# Patient Record
Sex: Female | Born: 1995 | Race: Black or African American | Hispanic: No | Marital: Single | Smoking: Former smoker
Health system: Southern US, Community
[De-identification: ages and names within clinical notes are randomized; demographics above are authoritative.]

## PROBLEM LIST (undated history)

## (undated) DIAGNOSIS — Z5189 Encounter for other specified aftercare: Secondary | ICD-10-CM

## (undated) DIAGNOSIS — N390 Urinary tract infection, site not specified: Secondary | ICD-10-CM

## (undated) HISTORY — PX: APPENDECTOMY: SHX54

---

## 2016-04-21 ENCOUNTER — Encounter (HOSPITAL_COMMUNITY): Payer: Self-pay | Admitting: Emergency Medicine

## 2016-04-21 ENCOUNTER — Inpatient Hospital Stay (HOSPITAL_COMMUNITY)
Admission: EM | Admit: 2016-04-21 | Discharge: 2016-04-27 | DRG: 871 | Disposition: A | Discharge status: Home / Self Care | Payer: Medicaid Other | Attending: Internal Medicine | Admitting: Internal Medicine

## 2016-04-21 ENCOUNTER — Emergency Department (HOSPITAL_COMMUNITY): Payer: Medicaid Other

## 2016-04-21 DIAGNOSIS — R197 Diarrhea, unspecified: Secondary | ICD-10-CM | POA: Diagnosis present

## 2016-04-21 DIAGNOSIS — Z87891 Personal history of nicotine dependence: Secondary | ICD-10-CM | POA: Diagnosis not present

## 2016-04-21 DIAGNOSIS — N12 Tubulo-interstitial nephritis, not specified as acute or chronic: Secondary | ICD-10-CM | POA: Diagnosis present

## 2016-04-21 DIAGNOSIS — Z79899 Other long term (current) drug therapy: Secondary | ICD-10-CM

## 2016-04-21 DIAGNOSIS — D509 Iron deficiency anemia, unspecified: Secondary | ICD-10-CM | POA: Diagnosis present

## 2016-04-21 DIAGNOSIS — J189 Pneumonia, unspecified organism: Secondary | ICD-10-CM | POA: Diagnosis present

## 2016-04-21 DIAGNOSIS — R161 Splenomegaly, not elsewhere classified: Secondary | ICD-10-CM

## 2016-04-21 DIAGNOSIS — Y95 Nosocomial condition: Secondary | ICD-10-CM | POA: Diagnosis not present

## 2016-04-21 DIAGNOSIS — E876 Hypokalemia: Secondary | ICD-10-CM | POA: Diagnosis present

## 2016-04-21 DIAGNOSIS — A419 Sepsis, unspecified organism: Principal | ICD-10-CM | POA: Diagnosis present

## 2016-04-21 DIAGNOSIS — K8020 Calculus of gallbladder without cholecystitis without obstruction: Secondary | ICD-10-CM | POA: Diagnosis present

## 2016-04-21 DIAGNOSIS — N390 Urinary tract infection, site not specified: Secondary | ICD-10-CM | POA: Diagnosis present

## 2016-04-21 DIAGNOSIS — R0602 Shortness of breath: Secondary | ICD-10-CM

## 2016-04-21 DIAGNOSIS — E860 Dehydration: Secondary | ICD-10-CM

## 2016-04-21 HISTORY — DX: Urinary tract infection, site not specified: N39.0

## 2016-04-21 HISTORY — DX: Encounter for other specified aftercare: Z51.89

## 2016-04-21 LAB — COMPREHENSIVE METABOLIC PANEL
ALK PHOS: 56 U/L (ref 38–126)
ALT: 19 U/L (ref 14–54)
AST: 28 U/L (ref 15–41)
Albumin: 4.5 g/dL (ref 3.5–5.0)
Anion gap: 15 (ref 5–15)
BILIRUBIN TOTAL: 10.4 mg/dL — AB (ref 0.3–1.2)
BUN: 20 mg/dL (ref 6–20)
CO2: 21 mmol/L — ABNORMAL LOW (ref 22–32)
Calcium: 9.3 mg/dL (ref 8.9–10.3)
Chloride: 100 mmol/L — ABNORMAL LOW (ref 101–111)
Creatinine, Ser: 0.87 mg/dL (ref 0.44–1.00)
GFR calc Af Amer: >60 mL/min (ref >60)
Glucose, Bld: 122 mg/dL — ABNORMAL HIGH (ref 65–99)
POTASSIUM: 2.7 mmol/L — AB (ref 3.5–5.1)
Sodium: 136 mmol/L (ref 135–145)
TOTAL PROTEIN: 9.4 g/dL — AB (ref 6.5–8.1)

## 2016-04-21 LAB — CBC WITH DIFFERENTIAL/PLATELET

## 2016-04-21 LAB — URINALYSIS, ROUTINE W REFLEX MICROSCOPIC
GLUCOSE, UA: NEGATIVE mg/dL
Ketones, ur: 20 mg/dL — AB
NITRITE: NEGATIVE
PH: 5 (ref 5.0–8.0)
Protein, ur: 100 mg/dL — AB
Specific Gravity, Urine: 1.017 (ref 1.005–1.030)

## 2016-04-21 LAB — CBC
HCT: 20.5 % — ABNORMAL LOW (ref 36.0–46.0)
HEMATOCRIT: 31.4 % — AB (ref 36.0–46.0)
Hemoglobin: 11.2 g/dL — ABNORMAL LOW (ref 12.0–15.0)
Hemoglobin: 7.3 g/dL — ABNORMAL LOW (ref 12.0–15.0)
MCH: 29 pg (ref 26.0–34.0)
MCH: 29.1 pg (ref 26.0–34.0)
MCHC: 35.7 g/dL (ref 30.0–36.0)
MCHC: 35.6 g/dL (ref 30.0–36.0)
MCV: 81.3 fL (ref 78.0–100.0)
MCV: 81.7 fL (ref 78.0–100.0)
PLATELETS: 207 10*3/uL (ref 150–400)
PLATELETS: 170 10*3/uL (ref 150–400)
RBC: 3.86 MIL/uL — ABNORMAL LOW (ref 3.87–5.11)
RBC: 2.51 MIL/uL — AB (ref 3.87–5.11)
RDW: 19.1 % — AB (ref 11.5–15.5)
RDW: 19.5 % — ABNORMAL HIGH (ref 11.5–15.5)
WBC: 34.2 10*3/uL — AB (ref 4.0–10.5)
WBC: 24.8 10*3/uL — AB (ref 4.0–10.5)

## 2016-04-21 LAB — HEPATIC FUNCTION PANEL
ALBUMIN: 3.4 g/dL — AB (ref 3.5–5.0)
ALT: 14 U/L (ref 14–54)
AST: 21 U/L (ref 15–41)
Alkaline Phosphatase: 44 U/L (ref 38–126)
Bilirubin, Direct: 3.2 mg/dL — ABNORMAL HIGH (ref 0.1–0.5)
Indirect Bilirubin: 3.8 mg/dL — ABNORMAL HIGH (ref 0.3–0.9)
TOTAL PROTEIN: 7.3 g/dL (ref 6.5–8.1)
Total Bilirubin: 7 mg/dL — ABNORMAL HIGH (ref 0.3–1.2)

## 2016-04-21 LAB — LACTATE DEHYDROGENASE: LDH: 237 U/L — AB (ref 98–192)

## 2016-04-21 LAB — DIFFERENTIAL
Basophils Absolute: 0.1 10*3/uL (ref 0.0–0.1)
Basophils Relative: 0 %
EOS PCT: 0 %
Eosinophils Absolute: 0 10*3/uL (ref 0.0–0.7)
LYMPHS ABS: 1.3 10*3/uL (ref 0.7–4.0)
LYMPHS PCT: 4 %
Monocytes Absolute: 1.4 10*3/uL — ABNORMAL HIGH (ref 0.1–1.0)
Monocytes Relative: 4 %
NEUTROS ABS: 29.9 10*3/uL — AB (ref 1.7–7.7)
NEUTROS PCT: 92 %

## 2016-04-21 LAB — APTT: aPTT: 39 seconds — ABNORMAL HIGH (ref 24–36)

## 2016-04-21 LAB — DIRECT ANTIGLOBULIN TEST (NOT AT ARMC)
DAT, IgG: NEGATIVE
DAT, complement: NEGATIVE

## 2016-04-21 LAB — RAPID URINE DRUG SCREEN, HOSP PERFORMED
Amphetamines: NOT DETECTED
BARBITURATES: NOT DETECTED
BENZODIAZEPINES: NOT DETECTED
Cocaine: NOT DETECTED
Opiates: NOT DETECTED
Tetrahydrocannabinol: POSITIVE — AB

## 2016-04-21 LAB — INFLUENZA PANEL BY PCR (TYPE A & B)
INFLAPCR: NEGATIVE
INFLBPCR: NEGATIVE

## 2016-04-21 LAB — LACTIC ACID, PLASMA
Lactic Acid, Venous: 1.1 mmol/L (ref 0.5–1.9)
Lactic Acid, Venous: 2.3 mmol/L (ref 0.5–1.9)

## 2016-04-21 LAB — I-STAT BETA HCG BLOOD, ED (MC, WL, AP ONLY): I-stat hCG, quantitative: <5 m[IU]/mL (ref <5)

## 2016-04-21 LAB — I-STAT CG4 LACTIC ACID, ED
Lactic Acid, Venous: 2.38 mmol/L (ref 0.5–1.9)
Lactic Acid, Venous: 1.07 mmol/L (ref 0.5–1.9)

## 2016-04-21 LAB — PROTIME-INR
INR: 1.53
PROTHROMBIN TIME: 18.5 s — AB (ref 11.4–15.2)

## 2016-04-21 LAB — CREATININE, SERUM
CREATININE: 0.76 mg/dL (ref 0.44–1.00)
GFR calc Af Amer: >60 mL/min (ref >60)
GFR calc non Af Amer: >60 mL/min (ref >60)

## 2016-04-21 LAB — LIPASE, BLOOD: Lipase: 19 U/L (ref 11–51)

## 2016-04-21 LAB — MRSA PCR SCREENING
MRSA by PCR: NEGATIVE
MRSA by PCR: NEGATIVE

## 2016-04-21 LAB — MAGNESIUM: MAGNESIUM: 1.6 mg/dL — AB (ref 1.7–2.4)

## 2016-04-21 LAB — PROCALCITONIN: Procalcitonin: 1.64 ng/mL

## 2016-04-21 MED ORDER — ONDANSETRON HCL 4 MG/2ML IJ SOLN
4.0000 mg | Freq: Once | INTRAMUSCULAR | Status: AC
  Administered 2016-04-21: 4 mg via INTRAVENOUS
  Filled 2016-04-21: qty 2

## 2016-04-21 MED ORDER — DEXTROSE 5 % IV SOLN
2.0000 g | Freq: Once | INTRAVENOUS | Status: DC
  Filled 2016-04-21: qty 2

## 2016-04-21 MED ORDER — SODIUM CHLORIDE 0.9 % IV BOLUS (SEPSIS): 1000.0000 mL | Freq: Once | INTRAVENOUS | Status: DC

## 2016-04-21 MED ORDER — ONDANSETRON HCL 4 MG/2ML IJ SOLN
4.0000 mg | Freq: Four times a day (QID) | INTRAMUSCULAR | Status: DC | PRN
  Administered 2016-04-21 – 2016-04-24 (×7): 4 mg via INTRAVENOUS
  Filled 2016-04-21 (×7): qty 2

## 2016-04-21 MED ORDER — SODIUM CHLORIDE 0.9 % IV BOLUS (SEPSIS)
1000.0000 mL | Freq: Once | INTRAVENOUS | Status: AC
  Administered 2016-04-21: 1000 mL via INTRAVENOUS

## 2016-04-21 MED ORDER — ENOXAPARIN SODIUM 40 MG/0.4ML ~~LOC~~ SOLN
40.0000 mg | SUBCUTANEOUS | Status: DC
  Filled 2016-04-21: qty 0.4

## 2016-04-21 MED ORDER — SODIUM CHLORIDE 0.9 % IV BOLUS (SEPSIS): 250.0000 mL | Freq: Once | INTRAVENOUS | Status: DC

## 2016-04-21 MED ORDER — POTASSIUM CHLORIDE 10 MEQ/100ML IV SOLN
10.0000 meq | INTRAVENOUS | Status: AC
  Administered 2016-04-21 (×2): 10 meq via INTRAVENOUS
  Filled 2016-04-21 (×2): qty 100

## 2016-04-21 MED ORDER — ACETAMINOPHEN 325 MG PO TABS
650.0000 mg | ORAL_TABLET | Freq: Four times a day (QID) | ORAL | Status: DC | PRN
  Administered 2016-04-21 – 2016-04-26 (×4): 650 mg via ORAL
  Filled 2016-04-21 (×4): qty 2

## 2016-04-21 MED ORDER — DEXTROSE 5 % IV SOLN
1.0000 g | Freq: Once | INTRAVENOUS | Status: AC
  Administered 2016-04-21: 1 g via INTRAVENOUS
  Filled 2016-04-21: qty 10

## 2016-04-21 MED ORDER — POTASSIUM CHLORIDE 10 MEQ/100ML IV SOLN: 10.0000 meq | INTRAVENOUS | Status: DC

## 2016-04-21 MED ORDER — ACETAMINOPHEN 650 MG RE SUPP
650.0000 mg | Freq: Once | RECTAL | Status: AC
  Administered 2016-04-21: 650 mg via RECTAL
  Filled 2016-04-21: qty 1

## 2016-04-21 MED ORDER — KETOROLAC TROMETHAMINE 30 MG/ML IJ SOLN
30.0000 mg | Freq: Once | INTRAMUSCULAR | Status: AC
  Administered 2016-04-21: 30 mg via INTRAVENOUS
  Filled 2016-04-21: qty 1

## 2016-04-21 MED ORDER — ACETAMINOPHEN 650 MG RE SUPP
650.0000 mg | Freq: Four times a day (QID) | RECTAL | Status: DC | PRN
  Administered 2016-04-21: 650 mg via RECTAL
  Filled 2016-04-21: qty 1

## 2016-04-21 MED ORDER — DEXTROSE 5 % IV SOLN: 1.0000 g | INTRAVENOUS | Status: DC

## 2016-04-21 MED ORDER — SODIUM CHLORIDE 0.9 % IV SOLN
30.0000 meq | Freq: Once | INTRAVENOUS | Status: AC
  Filled 2016-04-21: qty 15

## 2016-04-21 MED ORDER — SODIUM CHLORIDE 0.9 % IV SOLN
30.0000 meq | Freq: Once | INTRAVENOUS | Status: AC
  Administered 2016-04-21: 30 meq via INTRAVENOUS
  Filled 2016-04-21: qty 15

## 2016-04-21 MED ORDER — ACETAMINOPHEN 500 MG PO TABS
1000.0000 mg | ORAL_TABLET | Freq: Once | ORAL | Status: AC
  Administered 2016-04-21: 1000 mg via ORAL
  Filled 2016-04-21: qty 2

## 2016-04-21 MED ORDER — IOPAMIDOL (ISOVUE-300) INJECTION 61%
100.0000 mL | Freq: Once | INTRAVENOUS | Status: AC | PRN
  Administered 2016-04-21: 100 mL via INTRAVENOUS

## 2016-04-21 MED ORDER — POTASSIUM CHLORIDE IN NACL 20-0.9 MEQ/L-% IV SOLN
INTRAVENOUS | Status: DC
  Administered 2016-04-21 – 2016-04-23 (×3): via INTRAVENOUS
  Filled 2016-04-21 (×4): qty 1000

## 2016-04-21 MED ORDER — ONDANSETRON HCL 4 MG PO TABS: 4.0000 mg | ORAL_TABLET | Freq: Four times a day (QID) | ORAL | Status: DC | PRN

## 2016-04-21 MED ORDER — SODIUM CHLORIDE 0.9 % IV BOLUS (SEPSIS)
2000.0000 mL | Freq: Once | INTRAVENOUS | Status: AC
  Administered 2016-04-21: 2000 mL via INTRAVENOUS

## 2016-04-21 NOTE — H&P (Signed)
History and Physical    Sara Greekmaree Longfield ZOX:[id number] DOB: 07-17-1995 DOA: 04/21/2016  PCP: No PCP Per Patient  Patient coming from: home  Chief Complaint: vomiting  HPI: Sara Roth is a 20 y.o. female with no significant past medical history presents to ED with a 2 day history of vomiting. She reports that over the past few days she's experienced diffuse myalgias, persistent vomiting, unable to keep anything down, no diarrhea, right upper quadrant abdominal pain. She reports that her emesis is orange and occasionally green in color. She is unsure whether she's had any fever, but has had chills. She's felt increasingly weak. She reports being told that she has a urinary tract infection a few weeks ago and was prescribed a course of antibiotics. She ended up filling this prescription the day before yesterday due to financial issues. She denies any sick contacts. [facility] [employer] since she is an employee there. Denies any sore throat, cough, shortness of breath, URI type symptoms.  ED Course: She was evaluated in the emergency room where she was noted to be febrile with a temperature 103 Fahrenheit. She was tachycardic with a heart rate in the 120s to 160 range. Lab work reveals WBC count 34.2. Hemoglobin 11.2 and platelets of 207. Lactic acid elevated at 2.3. She is hypokalemic with a potassium of 2.7. Bilirubin is elevated at 10.4. Lipase is normal. Transaminases are normal. Urinalysis indicates infection. Influenza panel was negative. Pregnancy test is negative. CT scan of the abdomen and pelvis confirms left-sided pyelonephritis. There is no reported biliary ductal dilatation. Patient has been referred for admission.  Review of Systems: As per HPI otherwise 10 point review of systems negative.    Past Medical History:  Diagnosis Date  . Blood transfusion without reported diagnosis   . UTI (urinary tract infection)     Past Surgical History:  Procedure Laterality Date  .  APPENDECTOMY       reports that she has quit smoking. She has never used smokeless tobacco. She reports that she uses drugs, including Marijuana. She reports that she does not drink alcohol.  No Known Allergies  Family history: Family history reviewed and not pertinent  Prior to Admission medications   Medication Sig Start Date End Date Taking? Authorizing Provider  metroNIDAZOLE (FLAGYL) 500 MG tablet Take 500 mg by mouth 2 (two) times daily.   Yes Historical Provider, MD    Physical Exam: Vitals:   04/21/16 1430 04/21/16 1530 04/21/16 1600 04/21/16 1602  BP: 114/64 116/57 (!) 115/54   Pulse: (!) 129 (!) 121 (!) 127   Resp: (!) 30 (!) 35 (!) 38   Temp:    99.7 F (37.6 C)  TempSrc:    Oral  SpO2: 100% 98% 97%   Weight:      Height:          Constitutional: NAD, calm, comfortable Vitals:   04/21/16 1430 04/21/16 1530 04/21/16 1600 04/21/16 1602  BP: 114/64 116/57 (!) 115/54   Pulse: (!) 129 (!) 121 (!) 127   Resp: (!) 30 (!) 35 (!) 38   Temp:    99.7 F (37.6 C)  TempSrc:    Oral  SpO2: 100% 98% 97%   Weight:      Height:       Eyes: PERRL, lids and conjunctivae normal ENMT: Mucous membranes are moist. Posterior pharynx clear of any exudate or lesions.Normal dentition.  Neck: normal, supple, no masses, no thyromegaly Respiratory: clear to auscultation bilaterally, no wheezing,  no crackles. Normal respiratory effort. No accessory muscle use.  Cardiovascular: tachycardic, no murmurs / rubs / gallops. No extremity edema. 2+ pedal pulses. No carotid bruits.  Abdomen: tender in RUQ and epigastrium, no masses palpated. Bowel sounds positive.  Musculoskeletal: no clubbing / cyanosis. No joint deformity upper and lower extremities. Good ROM, no contractures. Normal muscle tone.  Skin: no rashes, lesions, ulcers. No induration Neurologic: CN 2-12 grossly intact. Sensation intact, DTR normal. Strength 5/5 in all 4.  Psychiatric: Normal judgment and insight. Alert and  oriented x 3. Normal mood.     Labs on Admission: I have personally reviewed following labs and imaging studies  CBC:  Recent Labs Lab 04/21/16 1147 04/21/16 1216  WBC 34.2* DUPLICATE  NEUTROABS 29.9* PENDING  HGB 11.2* DUPLICATE  HCT 31.4* DUPLICATE  MCV 81.3 DUPLICATE  PLT 207 DUPLICATE   Basic Metabolic Panel:  Recent Labs Lab 04/21/16 1147  NA 136  K 2.7*  CL 100*  CO2 21*  GLUCOSE 122*  BUN 20  CREATININE 0.87  CALCIUM 9.3   GFR: Estimated Creatinine Clearance: 92.8 mL/min (by C-G formula based on SCr of 0.87 mg/dL). Liver Function Tests:  Recent Labs Lab 04/21/16 1147  AST 28  ALT 19  ALKPHOS 56  BILITOT 10.4*  PROT 9.4*  ALBUMIN 4.5    Recent Labs Lab 04/21/16 1147  LIPASE 19   No results for input(s): AMMONIA in the last 168 hours. Coagulation Profile: No results for input(s): INR, PROTIME in the last 168 hours. Cardiac Enzymes: No results for input(s): CKTOTAL, CKMB, CKMBINDEX, TROPONINI in the last 168 hours. BNP (last 3 results) No results for input(s): PROBNP in the last 8760 hours. HbA1C: No results for input(s): HGBA1C in the last 72 hours. CBG: No results for input(s): GLUCAP in the last 168 hours. Lipid Profile: No results for input(s): CHOL, HDL, LDLCALC, TRIG, CHOLHDL, LDLDIRECT in the last 72 hours. Thyroid Function Tests: No results for input(s): TSH, T4TOTAL, FREET4, T3FREE, THYROIDAB in the last 72 hours. Anemia Panel: No results for input(s): VITAMINB12, FOLATE, FERRITIN, TIBC, IRON, RETICCTPCT in the last 72 hours. Urine analysis:    Component Value Date/Time   COLORURINE AMBER (A) 04/21/2016 1147   APPEARANCEUR HAZY (A) 04/21/2016 1147   LABSPEC 1.017 04/21/2016 1147   PHURINE 5.0 04/21/2016 1147   GLUCOSEU NEGATIVE 04/21/2016 1147   HGBUR MODERATE (A) 04/21/2016 1147   BILIRUBINUR SMALL (A) 04/21/2016 1147   KETONESUR 20 (A) 04/21/2016 1147   PROTEINUR 100 (A) 04/21/2016 1147   NITRITE NEGATIVE 04/21/2016 1147    LEUKOCYTESUR SMALL (A) 04/21/2016 1147   Sepsis Labs: !!!!!!!!!!!!!!!!!!!!!!!!!!!!!!!!!!!!!!!!!!!! @LABRCNTIP (procalcitonin:4,lacticidven:4) )No results found for this or any previous visit (from the past 240 hour(s)).   Radiological Exams on Admission: Ct Abdomen Pelvis W Contrast  Result Date: 04/21/2016 CLINICAL DATA:  Vomiting for 2 days, body aches, fever, headache, former smoker EXAM: CT ABDOMEN AND PELVIS WITH CONTRAST TECHNIQUE: Multidetector CT imaging of the abdomen and pelvis was performed using the standard protocol following bolus administration of intravenous contrast. Sagittal and coronal MPR images reconstructed from axial data set. CONTRAST:  ISOVUE-300 IOPAMIDOL (ISOVUE-300) INJECTION 61% IV. Oral contrast was not administered. COMPARISON:  None FINDINGS: Lower chest: Lung bases clear Hepatobiliary: Calcified gallstones in gallbladder largest 1.5 cm diameter. Liver unremarkable. Pancreas: Normal appearance Spleen: Prominent spleen, 14.8 x 5.4 x 14.3 cm (volume = 600 cm^3) without focal lesion Adrenals/Urinary Tract: Patchy LEFT nephrogram most consistent with pyelonephritis. No urinary tract calcification or dilatation. No  renal mass lesions. Ureters and bladder normal appearance. Stomach/Bowel: Appendix not definitely visualized. Suboptimal assessment of stomach and bowel loops due to lack of GI contrast and adequate distention, no gross abnormality seen Vascular/Lymphatic: Vascular structures unremarkable. Few normal sized LEFT para-aortic lymph nodes. Reproductive: Unremarkable Other: No free air free fluid.  No hernia. Musculoskeletal: Bones unremarkable. IMPRESSION: Patchy LEFT nephrogram consistent with pyelonephritis, recommend correlation with urinalysis. Cholelithiasis. Electronically Signed   By: Ulyses SouthwardMark  Boles M.D.   On: 04/21/2016 15:13   Dg Chest Portable 1 View  Result Date: 04/21/2016 CLINICAL DATA:  Code sepsis EXAM: PORTABLE CHEST 1 VIEW COMPARISON:  None.  FINDINGS: Normal heart size. Lungs clear. No pneumothorax. No pleural effusion. IMPRESSION: No active disease. Electronically Signed   By: Jolaine ClickArthur  Hoss M.D.   On: 04/21/2016 13:07    EKG: Independently reviewed. Sinus tachycardia  Assessment/Plan Active Problems:   UTI (urinary tract infection)   Pyelonephritis   Sepsis (HCC)   Hypokalemia   Hyperbilirubinemia   Splenomegaly   1. Sepsis. Likely related to pyelonephritis. She's been started on intravenous Rocephin. Blood cultures have been sent. She is aggressively hydrated with IV fluids per sepsis protocol. Follow blood cultures. Continue to trend lactic acid.  2. Pyelonephritis. Continue on Rocephin. Follow urine culture.  3. Hyperbilirubinemia. Etiology is unclear. She does not have any clear ductal dilatation, but with leukocytosis and fever, concern for underlying infection. I discussed the case with Dr. Elnoria HowardHung on-call for GI who [facility]. Other etiologies could include possible hemolysis. Will check LDH, haptoglobin, Coombs test and fractionated bilirubin. Case was also discussed with Dr. Clelia CroftShadad on-call for hematology who [facility].  4. Splenomegaly. Unclear significance. Await further hematology workup.  5. Hypokalemia. Likely related to persistent vomiting. Will replace and check magnesium.   DVT prophylaxis: lovenox Code Status: full Family Communication: no family present Disposition Plan: [facility] Consults called: GI Elnoria Howard(Hung), Hematology [facility](Shadad) Admission status: inpatient, stepdown   [facility]ON,JEHANZEB MD [facility] Pager [phone]  If 7PM-7AM, please contact night-coverage [email or web address] Password [email or web address]  04/21/2016, 4:35 PM

## 2016-04-21 NOTE — ED Notes (Signed)
CRITICAL VALUE ALERT  Critical value received:  I-stat Lactic 2.38                                       Potassium 2.7   Date of notification:  04/21/2016  Time of notification:  1234  Critical value read back:yes  Nurse who received alert:  J. Swanson/S. Gwen PoundsLashley  MD notified (1st page):  Zammit  Time of first page:  1234  MD notified (2nd page):  Time of second page:  Responding MD:  Estell HarpinZammit  Time MD responded:  1234

## 2016-04-21 NOTE — ED Triage Notes (Addendum)
Patient c/o nausea, vomiting, diarrhea, headache, body aches and fever that started 2 days ago and is progressively getting worse. Per patient being treated now for UTI and is currently taking antibiotics but is unaware of the name.

## 2016-04-21 NOTE — Progress Notes (Signed)
CRITICAL VALUE ALERT  Critical value received: Lactic Acid 2.3  Date of notification: 04/21/2016  Time of notification: 2345  Critical value read back:Yes.    Nurse who received alert:  A. Debborah Alonge  MD notified (1st page): Hamad,MD  Time of first page: 2350  MD notified (2nd page):  Time of second page:  Responding MD: Hamad,MD  Time MD responded: 2355

## 2016-04-21 NOTE — ED Notes (Signed)
Attempted to call report

## 2016-04-21 NOTE — ED Provider Notes (Signed)
[facility]-EMERGENCY DEPT Provider Note   CSN: [id number] Arrival date & time: 04/21/16  1121  By signing my name below, I, Soijett Blue, attest that this documentation has been prepared under the direction and in the presence of Bethann BerkshireJoseph Maurica Omura, MD. Electronically Signed: Soijett Blue, ED Scribe. 04/21/16. 12:17 PM.  History   Chief Complaint Chief Complaint  Patient presents with  . Emesis    HPI Sara Roth is a 20 y.o. female who presents to the Emergency Department complaining of progressively worsening emesis onset 2 days ago. She states that she is having associated symptoms of appetite change, generalized body aches, fever, and HA. She has not tried any medications for the relief of her symptoms. She denies sore throat, cough, and any other symptoms. Denies medical issues at this time.   The history is provided by the patient. No language interpreter was used.  Emesis   This is a new problem. The current episode started 2 days ago. The problem has not changed since onset.The emesis has an appearance of stomach contents. Associated symptoms include a fever, headaches and myalgias. Pertinent negatives include no abdominal pain, no chills, no cough and no diarrhea.    Past Medical History:  Diagnosis Date  . Blood transfusion without reported diagnosis   . UTI (urinary tract infection)     There are no active problems to display for this patient.   Past Surgical History:  Procedure Laterality Date  . APPENDECTOMY      OB History    Gravida Para Term Preterm AB Living   0 0 0 0 0 0   SAB TAB Ectopic Multiple Live Births   0 0 0 0 0       Home Medications    Prior to Admission medications   Not on File    Family History History reviewed. No pertinent family history.  Social History Social History  Substance Use Topics  . Smoking status: Former Games developermoker  . Smokeless tobacco: Never Used  . Alcohol use No     Allergies   Patient has no known  allergies.   Review of Systems Review of Systems  Constitutional: Positive for appetite change and fever. Negative for chills and fatigue.  HENT: Negative for congestion, ear discharge, sinus pressure and sore throat.   Eyes: Negative for discharge.  Respiratory: Negative for cough.   Cardiovascular: Negative for chest pain.  Gastrointestinal: Positive for vomiting. Negative for abdominal pain and diarrhea.  Genitourinary: Negative for frequency and hematuria.  Musculoskeletal: Positive for myalgias. Negative for back pain.  Skin: Negative for rash.  Neurological: Positive for headaches. Negative for seizures.  Psychiatric/Behavioral: Negative for hallucinations.     Physical Exam Updated Vital Signs BP 125/81   Pulse (!) 138   Temp 102.9 F (39.4 C) (Oral)   Resp 20   Ht 5\' 5"  (1.651 m)   Wt 148 lb (67.1 kg)   LMP 04/21/2016   SpO2 100%   BMI 24.63 kg/m   Physical Exam  Constitutional: She is oriented to person, place, and time. She appears well-developed.  HENT:  Head: Normocephalic.  Mouth/Throat: Mucous membranes are dry.  Eyes: Conjunctivae and EOM are normal. Scleral icterus is present.  Neck: Neck supple. No thyromegaly present.  Cardiovascular: Regular rhythm.  Tachycardia present.  Exam reveals no gallop and no friction rub.   No murmur heard. Pulmonary/Chest: No stridor. She has no wheezes. She has no rales. She exhibits no tenderness.  Abdominal: She exhibits no distension. There is  no tenderness. There is no rebound.  Musculoskeletal: Normal range of motion. She exhibits no edema.  Lymphadenopathy:    She has no cervical adenopathy.  Neurological: She is oriented to person, place, and time. She exhibits normal muscle tone. Coordination normal.  Skin: No rash noted. No erythema.  Psychiatric: She has a normal mood and affect. Her behavior is normal.  Nursing note and vitals reviewed.    ED Treatments / Results  DIAGNOSTIC STUDIES: Oxygen Saturation  is 100% on RA, nl by my interpretation.    COORDINATION OF CARE: 12:14 PM Discussed treatment plan with pt at bedside which includes labs, UA, tylenol, and pt agreed to plan.   Labs (all labs ordered are listed, but only abnormal results are displayed) Labs Reviewed  COMPREHENSIVE METABOLIC PANEL - Abnormal; Notable for the following:       Result Value   Potassium 2.7 (*)    Chloride 100 (*)    CO2 21 (*)    Glucose, Bld 122 (*)    Total Protein 9.4 (*)    Total Bilirubin 10.4 (*)    All other components within normal limits  CBC - Abnormal; Notable for the following:    WBC 34.2 (*)    RBC 3.86 (*)    Hemoglobin 11.2 (*)    HCT 31.4 (*)    RDW 19.1 (*)    All other components within normal limits  I-STAT CG4 LACTIC ACID, ED - Abnormal; Notable for the following:    Lactic Acid, Venous 2.38 (*)    All other components within normal limits  CULTURE, BLOOD (ROUTINE X 2)  CULTURE, BLOOD (ROUTINE X 2)  URINE CULTURE  LIPASE, BLOOD  CBC WITH DIFFERENTIAL/PLATELET  URINALYSIS, ROUTINE W REFLEX MICROSCOPIC  INFLUENZA PANEL BY PCR (TYPE A & B, H1N1)  RAPID URINE DRUG SCREEN, HOSP PERFORMED  I-STAT BETA HCG BLOOD, ED ([facility], [facility], [facility] ONLY)    EKG  EKG Interpretation None       Radiology No results found.  Procedures Procedures (including critical care time)  Medications Ordered in ED Medications  sodium chloride 0.9 % bolus 2,000 mL (not administered)  potassium chloride 10 mEq in 100 mL IVPB (not administered)  sodium chloride 0.9 % bolus 1,000 mL (not administered)  sodium chloride 0.9 % bolus 1,000 mL (1,000 mLs Intravenous New Bag/Given 04/21/16 1152)  sodium chloride 0.9 % bolus 1,000 mL (1,000 mLs Intravenous New Bag/Given 04/21/16 1152)  acetaminophen (TYLENOL) tablet 1,000 mg (1,000 mg Oral Given 04/21/16 1158)  ondansetron (ZOFRAN) injection 4 mg (4 mg Intravenous Given 04/21/16 1241)  ketorolac (TORADOL) 30 MG/ML injection 30 mg (30 mg Intravenous Given  04/21/16 1241)  cefTRIAXone (ROCEPHIN) 1 g in dextrose 5 % 50 mL IVPB (1 g Intravenous New Bag/Given 04/21/16 1241)     Initial Impression / Assessment and Plan / ED Course  I have reviewed the triage vital signs and the nursing notes.  Pertinent labs & imaging results that were available during my care of the patient were reviewed by me and considered in my medical decision making (see chart for details).  Clinical Course   CRITICAL CARE Performed by: Laquan Ludden L Total critical care time40 minutes Critical care time was exclusive of separately billable procedures and treating other patients. Critical care was necessary to treat or prevent imminent or life-threatening deterioration. Critical care was time spent personally by me on the following activities: development of treatment plan with patient and/or surrogate as well as nursing, discussions with consultants,  evaluation of patient's response to treatment, examination of patient, obtaining history from patient or surrogate, ordering and performing treatments and interventions, ordering and review of laboratory studies, ordering and review of radiographic studies, pulse oximetry and re-evaluation of patient's condition.   Patient will be admitted to stepdown. Diagnosis UTI elevated bilirubin.  The chart was scribed for me under my direct supervision.  I personally performed the history, physical, and medical decision making and all procedures in the evaluation of this patient..   Final Clinical Impressions(s) / ED Diagnoses   Final diagnoses:  None    New Prescriptions New Prescriptions   No medications on file   The chart was scribed for me under my direct supervision.  I personally performed the history, physical, and medical decision making and all procedures in the evaluation of this patient.Bethann Berkshire, MD 04/21/16 [phone]

## 2016-04-21 NOTE — ED Notes (Signed)
Pt ambulated to bathroom with no assist. 

## 2016-04-21 NOTE — ED Notes (Signed)
CRITICAL VALUE ALERT  Critical value received:  Potassium 2.7  Date of notification:  04/21/2016  Time of notification:  1233  Critical value read back:  Yes  Nurse who received alert:  Sophronia SimasJackie Keenon Leitzel  MD notified (1st page):  Dr. Estell HarpinZammit  Time of first page:  1234   Responding MD:  Dr. Estell HarpinZammit  Time MD responded:  [phone]

## 2016-04-21 NOTE — Consult Note (Signed)
Reason for Consult: Hyperbilirubinemia Referring Physician: [facility].  Orpah Greek HPI: This is a 20 year old female with no significant PMH admitted with complaints of vomiting, myalgias, and RUQ pain.  The symptoms started two days ago and it progressively worsened.  In the prior weeks she was told that she had a UTI and she was treated with antibiotics.  In the ER she was noted to have a TB at 10 and a WBC of 34,000.  A scan of the abdomen revealed a contracted gallbladder with a 1.5 cm calcified stone and no biliary ductal dilation.  Her liver enzymes were normal, but she complained about RUQ pain.  There was evidence of a left pyleonephritis.  Her serum lactate was >2.  Because of the high elevation in her TB a GI consultation was requested.  She was evaluated by surgery at [facility] and it was not felt that she had cholecystitis.  Repeat blood work here in [place] does reveal a trending down of her WBC to 24K and her TB dropped to 7.0  Fractionation was split evenly.  Past Medical History:  Diagnosis Date  . Blood transfusion without reported diagnosis   . UTI (urinary tract infection)     Past Surgical History:  Procedure Laterality Date  . APPENDECTOMY      History reviewed. No pertinent family history.  Social History:  reports that she has quit smoking. She has never used smokeless tobacco. She reports that she uses drugs, including Marijuana. She reports that she does not drink alcohol.  Allergies: No Known Allergies  Medications:  Scheduled: . acetaminophen  650 mg Rectal Once  . [START ON 04/22/2016] cefTRIAXone (ROCEPHIN)  IV  1 g Intravenous Q24H  . enoxaparin (LOVENOX) injection  40 mg Subcutaneous Q24H  . potassium chloride (KCL MULTIRUN) 30 mEq in 265 mL IVPB  30 mEq Intravenous Once  . potassium chloride (KCL MULTIRUN) 30 mEq in 265 mL IVPB  30 mEq Intravenous Once  . sodium chloride  1,000 mL Intravenous Once  . sodium chloride  1,000 mL Intravenous  Once   And  . sodium chloride  1,000 mL Intravenous Once   And  . sodium chloride  250 mL Intravenous Once  . sodium chloride  2,000 mL Intravenous Once   Continuous: . 0.9 % NaCl with KCl 20 mEq / L 100 mL/hr at 04/21/16 2038    Results for orders placed or performed during the hospital encounter of 04/21/16 (from the past 24 hour(s))  Lipase, blood     Status: None   Collection Time: 04/21/16 11:47 AM  Result Value Ref Range   Lipase 19 11 - 51 U/L  Comprehensive metabolic panel     Status: Abnormal   Collection Time: 04/21/16 11:47 AM  Result Value Ref Range   Sodium 136 135 - 145 mmol/L   Potassium 2.7 (LL) 3.5 - 5.1 mmol/L   Chloride 100 (L) 101 - 111 mmol/L   CO2 21 (L) 22 - 32 mmol/L   Glucose, Bld 122 (H) 65 - 99 mg/dL   BUN 20 6 - 20 mg/dL   Creatinine, Ser 1.61 0.44 - 1.00 mg/dL   Calcium 9.3 8.9 - 09.6 mg/dL   Total Protein 9.4 (H) 6.5 - 8.1 g/dL   Albumin 4.5 3.5 - 5.0 g/dL   AST 28 15 - 41 U/L   ALT 19 14 - 54 U/L   Alkaline Phosphatase 56 38 - 126 U/L   Total Bilirubin 10.4 (H)  0.3 - 1.2 mg/dL   GFR calc non Af Amer >60 >60 mL/min   GFR calc Af Amer >60 >60 mL/min   Anion gap 15 5 - 15  CBC     Status: Abnormal   Collection Time: 04/21/16 11:47 AM  Result Value Ref Range   WBC 34.2 (H) 4.0 - 10.5 K/uL   RBC 3.86 (L) 3.87 - 5.11 MIL/uL   Hemoglobin 11.2 (L) 12.0 - 15.0 g/dL   HCT 16.131.4 (L) 09.636.0 - 04.546.0 %   MCV 81.3 78.0 - 100.0 fL   MCH 29.0 26.0 - 34.0 pg   MCHC 35.7 30.0 - 36.0 g/dL   RDW 40.919.1 (H) 81.111.5 - 91.415.5 %   Platelets 207 150 - 400 K/uL  Urinalysis, Routine w reflex microscopic     Status: Abnormal   Collection Time: 04/21/16 11:47 AM  Result Value Ref Range   Color, Urine AMBER (A) YELLOW   APPearance HAZY (A) CLEAR   Specific Gravity, Urine 1.017 1.005 - 1.030   pH 5.0 5.0 - 8.0   Glucose, UA NEGATIVE NEGATIVE mg/dL   Hgb urine dipstick MODERATE (A) NEGATIVE   Bilirubin Urine SMALL (A) NEGATIVE   Ketones, ur 20 (A) NEGATIVE mg/dL    Protein, ur 782100 (A) NEGATIVE mg/dL   Nitrite NEGATIVE NEGATIVE   Leukocytes, UA SMALL (A) NEGATIVE   RBC / HPF 6-30 0 - 5 RBC/hpf   WBC, UA TOO NUMEROUS TO COUNT 0 - 5 WBC/hpf   Bacteria, UA RARE (A) NONE SEEN   Mucous PRESENT    Hyaline Casts, UA PRESENT    Non Squamous Epithelial 0-5 (A) NONE SEEN  Rapid urine drug screen (hospital performed)     Status: Abnormal   Collection Time: 04/21/16 11:47 AM  Result Value Ref Range   Opiates NONE DETECTED NONE DETECTED   Cocaine NONE DETECTED NONE DETECTED   Benzodiazepines NONE DETECTED NONE DETECTED   Amphetamines NONE DETECTED NONE DETECTED   Tetrahydrocannabinol POSITIVE (A) NONE DETECTED   Barbiturates NONE DETECTED NONE DETECTED  Differential     Status: Abnormal   Collection Time: 04/21/16 11:47 AM  Result Value Ref Range   Neutrophils Relative % 92 %   Neutro Abs 29.9 (H) 1.7 - 7.7 K/uL   Lymphocytes Relative 4 %   Lymphs Abs 1.3 0.7 - 4.0 K/uL   Monocytes Relative 4 %   Monocytes Absolute 1.4 (H) 0.1 - 1.0 K/uL   Eosinophils Relative 0 %   Eosinophils Absolute 0.0 0.0 - 0.7 K/uL   Basophils Relative 0 %   Basophils Absolute 0.1 0.0 - 0.1 K/uL  CBC WITH DIFFERENTIAL     Status: None (Preliminary result)   Collection Time: 04/21/16 12:16 PM  Result Value Ref Range   WBC DUPLICATE K/uL   RBC DUPLICATE MIL/uL   Hemoglobin DUPLICATE g/dL   HCT DUPLICATE %   MCV DUPLICATE fL   MCH DUPLICATE pg   MCHC DUPLICATE g/dL   RDW DUPLICATE %   Platelets DUPLICATE K/uL   Neutrophils Relative % PENDING %   Neutro Abs PENDING K/uL   Band Neutrophils PENDING %   Lymphocytes Relative PENDING %   Lymphs Abs PENDING K/uL   Monocytes Relative PENDING %   Monocytes Absolute PENDING K/uL   Eosinophils Relative PENDING %   Eosinophils Absolute PENDING K/uL   Basophils Relative PENDING %   Basophils Absolute PENDING K/uL   WBC Morphology PENDING    RBC Morphology PENDING    Smear Review  PENDING    Other PENDING %   nRBC PENDING /100  WBC   Metamyelocytes Relative PENDING %   Myelocytes PENDING %   Promyelocytes Absolute PENDING %   Blasts PENDING %  Influenza panel by PCR (type A & B, H1N1)     Status: None   Collection Time: 04/21/16 12:21 PM  Result Value Ref Range   Influenza A By PCR NEGATIVE NEGATIVE   Influenza B By PCR NEGATIVE NEGATIVE  I-Stat Beta hCG blood, ED ([facility], [facility], [facility] only)     Status: None   Collection Time: 04/21/16 12:24 PM  Result Value Ref Range   I-stat hCG, quantitative <5.0 <5 mIU/mL   Comment 3          I-Stat CG4 Lactic Acid, ED  (not at  [facility])     Status: Abnormal   Collection Time: 04/21/16 12:27 PM  Result Value Ref Range   Lactic Acid, Venous 2.38 (HH) 0.5 - 1.9 mmol/L   Comment NOTIFIED PHYSICIAN   Lactate dehydrogenase     Status: Abnormal   Collection Time: 04/21/16  4:44 PM  Result Value Ref Range   LDH 237 (H) 98 - 192 U/L  Hepatic function panel     Status: Abnormal   Collection Time: 04/21/16  4:44 PM  Result Value Ref Range   Total Protein 7.3 6.5 - 8.1 g/dL   Albumin 3.4 (L) 3.5 - 5.0 g/dL   AST 21 15 - 41 U/L   ALT 14 14 - 54 U/L   Alkaline Phosphatase 44 38 - 126 U/L   Total Bilirubin 7.0 (H) 0.3 - 1.2 mg/dL   Bilirubin, Direct 3.2 (H) 0.1 - 0.5 mg/dL   Indirect Bilirubin 3.8 (H) 0.3 - 0.9 mg/dL  MRSA PCR Screening     Status: None   Collection Time: 04/21/16  4:44 PM  Result Value Ref Range   MRSA by PCR NEGATIVE NEGATIVE  I-Stat CG4 Lactic Acid, ED  (not at  [facility])     Status: None   Collection Time: 04/21/16  5:07 PM  Result Value Ref Range   Lactic Acid, Venous 1.07 0.5 - 1.9 mmol/L  Lactic acid, plasma     Status: None   Collection Time: 04/21/16  7:07 PM  Result Value Ref Range   Lactic Acid, Venous 1.1 0.5 - 1.9 mmol/L  Procalcitonin     Status: None   Collection Time: 04/21/16  7:07 PM  Result Value Ref Range   Procalcitonin 1.64 ng/mL  Protime-INR     Status: Abnormal   Collection Time: 04/21/16  7:07 PM  Result Value Ref Range   Prothrombin Time  18.5 (H) 11.4 - 15.2 seconds   INR 1.53   APTT     Status: Abnormal   Collection Time: 04/21/16  7:07 PM  Result Value Ref Range   aPTT 39 (H) 24 - 36 seconds  CBC     Status: Abnormal   Collection Time: 04/21/16  7:07 PM  Result Value Ref Range   WBC 24.8 (H) 4.0 - 10.5 K/uL   RBC 2.51 (L) 3.87 - 5.11 MIL/uL   Hemoglobin 7.3 (L) 12.0 - 15.0 g/dL   HCT 16.1 (L) 09.6 - 04.5 %   MCV 81.7 78.0 - 100.0 fL   MCH 29.1 26.0 - 34.0 pg   MCHC 35.6 30.0 - 36.0 g/dL   RDW 40.9 (H) 81.1 - 91.4 %   Platelets 170 150 - 400 K/uL  Creatinine, serum  Status: None   Collection Time: 04/21/16  7:07 PM  Result Value Ref Range   Creatinine, Ser 0.76 0.44 - 1.00 mg/dL   GFR calc non Af Amer >60 >60 mL/min   GFR calc Af Amer >60 >60 mL/min  Magnesium     Status: Abnormal   Collection Time: 04/21/16  7:07 PM  Result Value Ref Range   Magnesium 1.6 (L) 1.7 - 2.4 mg/dL     Ct Abdomen Pelvis W Contrast  Result Date: 04/21/2016 CLINICAL DATA:  Vomiting for 2 days, body aches, fever, headache, former smoker EXAM: CT ABDOMEN AND PELVIS WITH CONTRAST TECHNIQUE: Multidetector CT imaging of the abdomen and pelvis was performed using the standard protocol following bolus administration of intravenous contrast. Sagittal and coronal MPR images reconstructed from axial data set. CONTRAST:  100mL ISOVUE-300 IOPAMIDOL (ISOVUE-300) INJECTION 61% IV. Oral contrast was not administered. COMPARISON:  None FINDINGS: Lower chest: Lung bases clear Hepatobiliary: Calcified gallstones in gallbladder largest 1.5 cm diameter. Liver unremarkable. Pancreas: Normal appearance Spleen: Prominent spleen, 14.8 x 5.4 x 14.3 cm (volume = 600 cm^3) without focal lesion Adrenals/Urinary Tract: Patchy LEFT nephrogram most consistent with pyelonephritis. No urinary tract calcification or dilatation. No renal mass lesions. Ureters and bladder normal appearance. Stomach/Bowel: Appendix not definitely visualized. Suboptimal assessment of stomach  and bowel loops due to lack of GI contrast and adequate distention, no gross abnormality seen Vascular/Lymphatic: Vascular structures unremarkable. Few normal sized LEFT para-aortic lymph nodes. Reproductive: Unremarkable Other: No free air free fluid.  No hernia. Musculoskeletal: Bones unremarkable. IMPRESSION: Patchy LEFT nephrogram consistent with pyelonephritis, recommend correlation with urinalysis. Cholelithiasis. Electronically Signed   By: Ulyses SouthwardMark  Boles M.D.   On: 04/21/2016 15:13   Dg Chest Portable 1 View  Result Date: 04/21/2016 CLINICAL DATA:  Code sepsis EXAM: PORTABLE CHEST 1 VIEW COMPARISON:  None. FINDINGS: Normal heart size. Lungs clear. No pneumothorax. No pleural effusion. IMPRESSION: No active disease. Electronically Signed   By: Jolaine ClickArthur  Hoss M.D.   On: 04/21/2016 13:07    ROS:  As stated above in the HPI otherwise negative.  Blood pressure 119/74, pulse (!) 129, temperature (!) 100.5 F (38.1 C), temperature source Oral, resp. rate (!) 27, height 5\' 5"  (1.651 m), weight 67.1 kg (148 lb), last menstrual period 04/21/2016, SpO2 100 %.    PE: Gen: NAD, Alert and Oriented HEENT:  Unable to evaluate in the dark. Neck: Supple, no LAD Lungs: CTA Bilaterally CV: RRR without M/G/R ABM: Soft, NTND, +BS Ext: No C/C/E  Assessment/Plan: 1) Sepsis. 2) Pyelonephritis. 3) Cholelithiasis.   I believe the patient's hyperbilirubinemia is secondary to sepsis as there are no other supporting facts for a biliary soruce, I.e., elevated liver enzymes in an obstructive pattern or dilated bile ducts.  I do not feel that her cholelithiasis plays a role in this situation, but only time will tell with the treatment of her RUQ pain.  I cannot reconcile her pain at this time in the RUQ, but she also has diffuse myalgias.  However,at this time she denies any further pain.  She did not provide any details for me during this interview as she was trying to sleep.  Nursing reported the same issue.  The  examination was negative for any pain.  Plan: 1) Treat her pyelonephritis. 2) Continue supportive care. 3) Follow labs.  Desirey Keahey D 04/21/2016, 8:47 PM

## 2016-04-22 DIAGNOSIS — D649 Anemia, unspecified: Secondary | ICD-10-CM

## 2016-04-22 DIAGNOSIS — N10 Acute tubulo-interstitial nephritis: Secondary | ICD-10-CM

## 2016-04-22 LAB — CBC
HCT: 20.4 % — ABNORMAL LOW (ref 36.0–46.0)
HEMATOCRIT: 19 % — AB (ref 36.0–46.0)
Hemoglobin: 7 g/dL — ABNORMAL LOW (ref 12.0–15.0)
Hemoglobin: 6.7 g/dL — CL (ref 12.0–15.0)
MCH: 28.2 pg (ref 26.0–34.0)
MCH: 28.9 pg (ref 26.0–34.0)
MCHC: 34.3 g/dL (ref 30.0–36.0)
MCHC: 35.3 g/dL (ref 30.0–36.0)
MCV: 82.3 fL (ref 78.0–100.0)
MCV: 81.9 fL (ref 78.0–100.0)
PLATELETS: 158 10*3/uL (ref 150–400)
PLATELETS: 173 10*3/uL (ref 150–400)
RBC: 2.48 MIL/uL — ABNORMAL LOW (ref 3.87–5.11)
RBC: 2.32 MIL/uL — AB (ref 3.87–5.11)
RDW: 19.5 % — AB (ref 11.5–15.5)
RDW: 19.2 % — AB (ref 11.5–15.5)
WBC: 18.4 10*3/uL — ABNORMAL HIGH (ref 4.0–10.5)
WBC: 15.2 10*3/uL — AB (ref 4.0–10.5)

## 2016-04-22 LAB — BASIC METABOLIC PANEL
Anion gap: 6 (ref 5–15)
BUN: 5 mg/dL — AB (ref 6–20)
CHLORIDE: 110 mmol/L (ref 101–111)
CO2: 18 mmol/L — ABNORMAL LOW (ref 22–32)
Calcium: 7.7 mg/dL — ABNORMAL LOW (ref 8.9–10.3)
Creatinine, Ser: 0.73 mg/dL (ref 0.44–1.00)
Glucose, Bld: 113 mg/dL — ABNORMAL HIGH (ref 65–99)
POTASSIUM: 3.1 mmol/L — AB (ref 3.5–5.1)
Sodium: 134 mmol/L — ABNORMAL LOW (ref 135–145)

## 2016-04-22 LAB — COMPREHENSIVE METABOLIC PANEL
ALBUMIN: 2.9 g/dL — AB (ref 3.5–5.0)
ALT: 14 U/L (ref 14–54)
ANION GAP: 8 (ref 5–15)
AST: 23 U/L (ref 15–41)
Alkaline Phosphatase: 41 U/L (ref 38–126)
BUN: 9 mg/dL (ref 6–20)
CHLORIDE: 112 mmol/L — AB (ref 101–111)
CO2: 18 mmol/L — AB (ref 22–32)
Calcium: 7.9 mg/dL — ABNORMAL LOW (ref 8.9–10.3)
Creatinine, Ser: 0.74 mg/dL (ref 0.44–1.00)
GFR calc Af Amer: >60 mL/min (ref >60)
GFR calc non Af Amer: >60 mL/min (ref >60)
GLUCOSE: 112 mg/dL — AB (ref 65–99)
POTASSIUM: 3.4 mmol/L — AB (ref 3.5–5.1)
SODIUM: 138 mmol/L (ref 135–145)
Total Bilirubin: 5.5 mg/dL — ABNORMAL HIGH (ref 0.3–1.2)
Total Protein: 6.3 g/dL — ABNORMAL LOW (ref 6.5–8.1)

## 2016-04-22 LAB — PREPARE RBC (CROSSMATCH)

## 2016-04-22 LAB — LACTIC ACID, PLASMA: LACTIC ACID, VENOUS: 1.4 mmol/L (ref 0.5–1.9)

## 2016-04-22 LAB — ABO/RH: ABO/RH(D): O POS

## 2016-04-22 LAB — HEMOGLOBIN AND HEMATOCRIT, BLOOD
HEMATOCRIT: 21.9 % — AB (ref 36.0–46.0)
HEMOGLOBIN: 7.8 g/dL — AB (ref 12.0–15.0)

## 2016-04-22 MED ORDER — PANTOPRAZOLE SODIUM 40 MG IV SOLR
40.0000 mg | Freq: Once | INTRAVENOUS | Status: AC
  Administered 2016-04-22: 40 mg via INTRAVENOUS
  Filled 2016-04-22: qty 40

## 2016-04-22 MED ORDER — IBUPROFEN 200 MG PO TABS
400.0000 mg | ORAL_TABLET | ORAL | Status: DC | PRN
  Administered 2016-04-22: 400 mg via ORAL
  Filled 2016-04-22: qty 2

## 2016-04-22 MED ORDER — VANCOMYCIN HCL IN DEXTROSE 750-5 MG/150ML-% IV SOLN
750.0000 mg | Freq: Three times a day (TID) | INTRAVENOUS | Status: DC
  Administered 2016-04-22 – 2016-04-23 (×4): 750 mg via INTRAVENOUS
  Filled 2016-04-22 (×6): qty 150

## 2016-04-22 MED ORDER — MAGNESIUM SULFATE 2 GM/50ML IV SOLN
2.0000 g | Freq: Once | INTRAVENOUS | Status: AC
  Administered 2016-04-22: 2 g via INTRAVENOUS
  Filled 2016-04-22: qty 50

## 2016-04-22 MED ORDER — PIPERACILLIN-TAZOBACTAM 3.375 G IVPB
3.3750 g | Freq: Three times a day (TID) | INTRAVENOUS | Status: DC
  Administered 2016-04-22 – 2016-04-23 (×4): 3.375 g via INTRAVENOUS
  Filled 2016-04-22 (×5): qty 50

## 2016-04-22 MED ORDER — MORPHINE SULFATE (PF) 2 MG/ML IV SOLN
INTRAVENOUS | Status: AC
  Administered 2016-04-22: 2 mg via INTRAVENOUS
  Filled 2016-04-22: qty 1

## 2016-04-22 MED ORDER — MORPHINE SULFATE (PF) 2 MG/ML IV SOLN
2.0000 mg | INTRAVENOUS | Status: DC | PRN
  Administered 2016-04-22 – 2016-04-24 (×4): 2 mg via INTRAVENOUS
  Filled 2016-04-22 (×4): qty 1

## 2016-04-22 MED ORDER — KETOROLAC TROMETHAMINE 15 MG/ML IJ SOLN
15.0000 mg | Freq: Three times a day (TID) | INTRAMUSCULAR | Status: AC | PRN
  Administered 2016-04-23: 15 mg via INTRAVENOUS
  Filled 2016-04-22 (×2): qty 1

## 2016-04-22 MED ORDER — SODIUM CHLORIDE 0.9 % IV BOLUS (SEPSIS)
1000.0000 mL | Freq: Once | INTRAVENOUS | Status: AC
  Administered 2016-04-21: 1000 mL via INTRAVENOUS

## 2016-04-22 MED ORDER — SODIUM CHLORIDE 0.9 % IV SOLN
Freq: Once | INTRAVENOUS | Status: AC
  Administered 2016-04-22: 15:00:00 via INTRAVENOUS

## 2016-04-22 MED ORDER — POTASSIUM CHLORIDE CRYS ER 20 MEQ PO TBCR
40.0000 meq | EXTENDED_RELEASE_TABLET | Freq: Once | ORAL | Status: DC
Start: 2016-04-22 — End: 2016-04-22

## 2016-04-22 NOTE — Progress Notes (Signed)
MD aware of Magnesium results of 1.6/ No new orders received at this time. Will continue to monitor closely.

## 2016-04-22 NOTE — Progress Notes (Signed)
Subjective: No complaints of abdominal pain.  Still with vomiting.  Objective: Vital signs in last 24 hours: Temp:  [99.7 F (37.6 C)-103 F (39.4 C)] 101.8 F (38.8 C) (12/24 0700) Pulse Rate:  [121-167] 122 (12/24 0900) Resp:  [18-44] 31 (12/24 0900) BP: (100-125)/(46-81) 121/69 (12/24 0900) SpO2:  [97 %-100 %] 100 % (12/24 0900) Weight:  [67.1 kg (147 lb 14.9 oz)-67.1 kg (148 lb)] 67.1 kg (147 lb 14.9 oz) (12/23 2000) Last BM Date: 04/21/16  Intake/Output from previous day: 12/23 0701 - 12/24 0700 In: 1936.7 [I.V.:736.7; IV Piggyback:1200] Out: -  Intake/Output this shift: No intake/output data recorded.  General appearance: alert and no distress GI: soft, non-tender; bowel sounds normal; no masses,  no organomegaly  Lab Results:  Recent Labs  04/21/16 1216 04/21/16 1907 04/22/16 0510  WBC DUPLICATE 24.8* 18.4*  HGB DUPLICATE 7.3* 7.0*  HCT DUPLICATE 20.5* 20.4*  PLT DUPLICATE 170 158   BMET  Recent Labs  04/21/16 1147 04/21/16 1907 04/22/16 0510  NA 136  --  138  K 2.7*  --  3.4*  CL 100*  --  112*  CO2 21*  --  18*  GLUCOSE 122*  --  112*  BUN 20  --  9  CREATININE 0.87 0.76 0.74  CALCIUM 9.3  --  7.9*   LFT  Recent Labs  04/21/16 1644 04/22/16 0510  PROT 7.3 6.3*  ALBUMIN 3.4* 2.9*  AST 21 23  ALT 14 14  ALKPHOS 44 41  BILITOT 7.0* 5.5*  BILIDIR 3.2*  --   IBILI 3.8*  --    PT/INR  Recent Labs  04/21/16 1907  LABPROT 18.5*  INR 1.53   Hepatitis Panel No results for input(s): HEPBSAG, HCVAB, HEPAIGM, HEPBIGM in the last 72 hours. C-Diff No results for input(s): CDIFFTOX in the last 72 hours. Fecal Lactopherrin No results for input(s): FECLLACTOFRN in the last 72 hours.  Studies/Results: Ct Abdomen Pelvis W Contrast  Result Date: 04/21/2016 CLINICAL DATA:  Vomiting for 2 days, body aches, fever, headache, former smoker EXAM: CT ABDOMEN AND PELVIS WITH CONTRAST TECHNIQUE: Multidetector CT imaging of the abdomen and pelvis was  performed using the standard protocol following bolus administration of intravenous contrast. Sagittal and coronal MPR images reconstructed from axial data set. CONTRAST:  100mL ISOVUE-300 IOPAMIDOL (ISOVUE-300) INJECTION 61% IV. Oral contrast was not administered. COMPARISON:  None FINDINGS: Lower chest: Lung bases clear Hepatobiliary: Calcified gallstones in gallbladder largest 1.5 cm diameter. Liver unremarkable. Pancreas: Normal appearance Spleen: Prominent spleen, 14.8 x 5.4 x 14.3 cm (volume = 600 cm^3) without focal lesion Adrenals/Urinary Tract: Patchy LEFT nephrogram most consistent with pyelonephritis. No urinary tract calcification or dilatation. No renal mass lesions. Ureters and bladder normal appearance. Stomach/Bowel: Appendix not definitely visualized. Suboptimal assessment of stomach and bowel loops due to lack of GI contrast and adequate distention, no gross abnormality seen Vascular/Lymphatic: Vascular structures unremarkable. Few normal sized LEFT para-aortic lymph nodes. Reproductive: Unremarkable Other: No free air free fluid.  No hernia. Musculoskeletal: Bones unremarkable. IMPRESSION: Patchy LEFT nephrogram consistent with pyelonephritis, recommend correlation with urinalysis. Cholelithiasis. Electronically Signed   By: Ulyses SouthwardMark  Boles M.D.   On: 04/21/2016 15:13   Dg Chest Portable 1 View  Result Date: 04/21/2016 CLINICAL DATA:  Code sepsis EXAM: PORTABLE CHEST 1 VIEW COMPARISON:  None. FINDINGS: Normal heart size. Lungs clear. No pneumothorax. No pleural effusion. IMPRESSION: No active disease. Electronically Signed   By: Jolaine ClickArthur  Hoss M.D.   On: 04/21/2016 13:07  Medications:  Scheduled: . enoxaparin (LOVENOX) injection  40 mg Subcutaneous Q24H  . piperacillin-tazobactam (ZOSYN)  IV  3.375 g Intravenous Q8H  . sodium chloride  1,000 mL Intravenous Once   And  . sodium chloride  1,000 mL Intravenous Once   And  . sodium chloride  250 mL Intravenous Once  . vancomycin  750 mg  Intravenous Q8H   Continuous: . 0.9 % NaCl with KCl 20 mEq / L 125 mL/hr (04/22/16 0820)    Assessment/Plan: 1) Sepsis with hyperbilirubinemia. 2) Pyelonephritis. 3) Febrile.   There are no biliary issues to evaluate or treat.  Her TB and WBC are trending downwards with treatment of the pyelonephritis.  Plan: 1) No further GI intervention.  Signing off.  LOS: 1 day   Lether Tesch D 04/22/2016, 9:47 AM

## 2016-04-22 NOTE — Progress Notes (Signed)
PROGRESS NOTE [facility]   Sara Roth   WJX:[id number] DOB: 1996-04-24  DOA: 04/21/2016 PCP: No PCP Per Patient   Brief Narrative: Sara Roth is a 20 y.o. female with no significant past medical history presents to ED with a 2 day history of vomiting. She reports that over the past few days she's experienced diffuse myalgias, persistent vomiting, unable to keep anything down, no diarrhea, right upper quadrant abdominal pain. She reports that her emesis is orange and occasionally green in color. She is unsure whether she's had any fever, but has had chills. She's felt increasingly weak. She reports being told that she has a urinary tract infection a few weeks ago and was prescribed a course of antibiotics. She ended up filling this prescription the day before yesterday due to financial issues. She denies any sick contacts. She often eats at [facility] [employer] [employer] since she is an employee there. Denies any sore throat, cough, shortness of breath, URI type symptoms.  Subjective: Patient seen and examined with nurse at bedside. C/o abdominal pain, last vomited overnight, also having diarrhea. Patient also c/o headaches and dizziness. Had fever over night Tmax 101.8  Assessment & Plan: Sepsis 2/2 pyelonephritis - s/p ~ 5L of IVF, Lactic acid and WBC trending down, still febrile   Continue empiric abx  Continue IVF  Follow blood and urine culture cultures  Supportive treatment for fever  Continue to monitor in SDU   Anemia - unknown etiology at this time, patient in her menstrual period but doubt this only cause, probably some component of hemodilution as well given aggressive hydration.  Hb base line ~ 11 current Hb 6.7. Also hemolysis could be playing part as well, LDH mild elevated - 2/2 to sepsis.  Transfuse 1 unit of PRBC's Monitor CBC  Anemia panel  Heme recommendations appreciated   Elevated total bili  Splenomegaly  Per heme and GI likely 2/2 to septic process, bili trending down  will continue to monitor GI recommendations appreciated    Hypokalemia - likely 2/2 to diarrhea patient reports having loose BM since yesterday  Replete  Check BMP in AM   DVT prophylaxis: Lovenox  Code Status: FULL  Family Communication: None at bedside  Disposition Plan: Home when medically stable    Consultants:   GI   HEME  Procedures:   None   Antimicrobials:  Zosyn 12/23  Vanco 12/23   Objective: Vitals:   04/22/16 0200 04/22/16 0300 04/22/16 0400 04/22/16 0700  BP:   109/65 (!) 111/55  Pulse: (!) 132 (!) 135 (!) 125 (!) 135  Resp: (!) 44 (!) 24 (!) 34 (!) 21  Temp: (!) 101.8 F (38.8 C) (!) 102.6 F (39.2 C) (!) 101.3 F (38.5 C) (!) 101.8 F (38.8 C)  TempSrc: Oral Oral Oral Oral  SpO2: 99% 99% 99% 100%  Weight:      Height:        Intake/Output Summary (Last 24 hours) at 04/22/16 0809 Last data filed at 04/22/16 0400  Gross per 24 hour  Intake          1936.67 ml  Output                0 ml  Net          1936.67 ml   Filed Weights   04/21/16 1127 04/21/16 2000  Weight: 67.1 kg (148 lb) 67.1 kg (147 lb 14.9 oz)    Examination:  General exam: Mild distress due to abdominal pain, anxious, Scleral icterus  Respiratory system: Clear to auscultation. No wheezes,crackle or rhonchi Cardiovascular system: S1 & S2 heard, Tachy@120 . No JVD, murmurs, rubs or gallops Gastrointestinal system: Abd soft, LLQ and epigastric tenderness, Left CVA tenderness  Central nervous system: Alert and oriented.  Extremities: No pedal edema.  Skin: No rashes, lesions or ulcers Psychiatry: Judgement and insight appear normal. Mood & affect appropriate.    Data Reviewed: I have personally reviewed following labs and imaging studies  CBC:  Recent Labs Lab 04/21/16 1147 04/21/16 1216 04/21/16 1907 04/22/16 0510  WBC 34.2* DUPLICATE 24.8* 18.4*  NEUTROABS 29.9* PENDING  --   --   HGB 11.2* DUPLICATE 7.3* 7.0*  HCT 31.4* DUPLICATE 20.5* 20.4*  MCV 81.3  DUPLICATE 81.7 82.3  PLT 207 DUPLICATE 170 158   Basic Metabolic Panel:  Recent Labs Lab 04/21/16 1147 04/21/16 1907 04/22/16 0510  NA 136  --  138  K 2.7*  --  3.4*  CL 100*  --  112*  CO2 21*  --  18*  GLUCOSE 122*  --  112*  BUN 20  --  9  CREATININE 0.87 0.76 0.74  CALCIUM 9.3  --  7.9*  MG  --  1.6*  --    GFR: Estimated Creatinine Clearance: 100.9 mL/min (by C-G formula based on SCr of 0.74 mg/dL). Liver Function Tests:  Recent Labs Lab 04/21/16 1147 04/21/16 1644 04/22/16 0510  AST 28 21 23   ALT 19 14 14   ALKPHOS 56 44 41  BILITOT 10.4* 7.0* 5.5*  PROT 9.4* 7.3 6.3*  ALBUMIN 4.5 3.4* 2.9*    Recent Labs Lab 04/21/16 1147  LIPASE 19   No results for input(s): AMMONIA in the last 168 hours. Coagulation Profile:  Recent Labs Lab 04/21/16 1907  INR 1.53   Cardiac Enzymes: No results for input(s): CKTOTAL, CKMB, CKMBINDEX, TROPONINI in the last 168 hours. BNP (last 3 results) No results for input(s): PROBNP in the last 8760 hours. HbA1C: No results for input(s): HGBA1C in the last 72 hours. CBG: No results for input(s): GLUCAP in the last 168 hours. Lipid Profile: No results for input(s): CHOL, HDL, LDLCALC, TRIG, CHOLHDL, LDLDIRECT in the last 72 hours. Thyroid Function Tests: No results for input(s): TSH, T4TOTAL, FREET4, T3FREE, THYROIDAB in the last 72 hours. Anemia Panel: No results for input(s): VITAMINB12, FOLATE, FERRITIN, TIBC, IRON, RETICCTPCT in the last 72 hours. Sepsis Labs:  Recent Labs Lab 04/21/16 1707 04/21/16 1907 04/21/16 2251 04/22/16 0510  PROCALCITON  --  1.64  --   --   LATICACIDVEN 1.07 1.1 2.3* 1.4    Recent Results (from the past 240 hour(s))  Blood Culture (routine x 2)     Status: None (Preliminary result)   Collection Time: 04/21/16 11:45 AM  Result Value Ref Range Status   Specimen Description RIGHT ANTECUBITAL  Final   Special Requests BOTTLES DRAWN AEROBIC AND ANAEROBIC 6CC EACH  Final   Culture NO  GROWTH < 24 HOURS  Final   Report Status PENDING  Incomplete  Blood Culture (routine x 2)     Status: None (Preliminary result)   Collection Time: 04/21/16 12:39 PM  Result Value Ref Range Status   Specimen Description LEFT ANTECUBITAL  Final   Special Requests BOTTLES DRAWN AEROBIC AND ANAEROBIC 6CC EACH  Final   Culture NO GROWTH < 24 HOURS  Final   Report Status PENDING  Incomplete  MRSA PCR Screening     Status: None   Collection Time: 04/21/16  4:44 PM  Result  Value Ref Range Status   MRSA by PCR NEGATIVE NEGATIVE Final    Comment:        The GeneXpert MRSA Assay (FDA approved for NASAL specimens only), is one component of a comprehensive MRSA colonization surveillance program. It is not intended to diagnose MRSA infection nor to guide or monitor treatment for MRSA infections.   MRSA PCR Screening     Status: None   Collection Time: 04/21/16  7:00 PM  Result Value Ref Range Status   MRSA by PCR NEGATIVE NEGATIVE Final    Comment:        The GeneXpert MRSA Assay (FDA approved for NASAL specimens only), is one component of a comprehensive MRSA colonization surveillance program. It is not intended to diagnose MRSA infection nor to guide or monitor treatment for MRSA infections.        Radiology Studies: Ct Abdomen Pelvis W Contrast  Result Date: 04/21/2016 CLINICAL DATA:  Vomiting for 2 days, body aches, fever, headache, former smoker EXAM: CT ABDOMEN AND PELVIS WITH CONTRAST TECHNIQUE: Multidetector CT imaging of the abdomen and pelvis was performed using the standard protocol following bolus administration of intravenous contrast. Sagittal and coronal MPR images reconstructed from axial data set. CONTRAST:  100mL ISOVUE-300 IOPAMIDOL (ISOVUE-300) INJECTION 61% IV. Oral contrast was not administered. COMPARISON:  None FINDINGS: Lower chest: Lung bases clear Hepatobiliary: Calcified gallstones in gallbladder largest 1.5 cm diameter. Liver unremarkable. Pancreas:  Normal appearance Spleen: Prominent spleen, 14.8 x 5.4 x 14.3 cm (volume = 600 cm^3) without focal lesion Adrenals/Urinary Tract: Patchy LEFT nephrogram most consistent with pyelonephritis. No urinary tract calcification or dilatation. No renal mass lesions. Ureters and bladder normal appearance. Stomach/Bowel: Appendix not definitely visualized. Suboptimal assessment of stomach and bowel loops due to lack of GI contrast and adequate distention, no gross abnormality seen Vascular/Lymphatic: Vascular structures unremarkable. Few normal sized LEFT para-aortic lymph nodes. Reproductive: Unremarkable Other: No free air free fluid.  No hernia. Musculoskeletal: Bones unremarkable. IMPRESSION: Patchy LEFT nephrogram consistent with pyelonephritis, recommend correlation with urinalysis. Cholelithiasis. Electronically Signed   By: Ulyses SouthwardMark  Boles M.D.   On: 04/21/2016 15:13   Dg Chest Portable 1 View  Result Date: 04/21/2016 CLINICAL DATA:  Code sepsis EXAM: PORTABLE CHEST 1 VIEW COMPARISON:  None. FINDINGS: Normal heart size. Lungs clear. No pneumothorax. No pleural effusion. IMPRESSION: No active disease. Electronically Signed   By: Jolaine ClickArthur  Hoss M.D.   On: 04/21/2016 13:07     Scheduled Meds: . enoxaparin (LOVENOX) injection  40 mg Subcutaneous Q24H  . piperacillin-tazobactam (ZOSYN)  IV  3.375 g Intravenous Q8H  . sodium chloride  1,000 mL Intravenous Once   And  . sodium chloride  1,000 mL Intravenous Once   And  . sodium chloride  250 mL Intravenous Once  . vancomycin  750 mg Intravenous Q8H   Continuous Infusions: . 0.9 % NaCl with KCl 20 mEq / L 100 mL/hr at 04/22/16 0557     LOS: 1 day    Latrelle DodrillEdwin Silva, MD [facility] Pager [phone]  If 7PM-7AM, please contact night-coverage [email or web address] Password [email or web address] 04/22/2016, 8:09 AM

## 2016-04-22 NOTE — Progress Notes (Signed)
Pharmacy Antibiotic Note  Orpah Greekmaree Chabot is a 20 y.o. female admitted on 04/21/2016 with sepsis.  Pharmacy has been consulted for Vancomycin/Zosyn dosing. [facility] Had been on ceftriaxone for UTI but now broadening coverage with elevated WBC and continued fever.   Plan: -Vancomycin 750 mg IV q8h -Zosyn 3.375G IV q8h to be infused over 4 hours -Trend WBC, temp, renal function  -Drug levels as indicated   Height: 5\' 5"  (165.1 cm) Weight: 147 lb 14.9 oz (67.1 kg) IBW/kg (Calculated) : 57  Temp (24hrs), Avg:101.4 F (38.6 C), Min:99.7 F (37.6 C), Max:103 F (39.4 C)   Recent Labs Lab 04/21/16 1147 04/21/16 1216 04/21/16 1227 04/21/16 1707 04/21/16 1907 04/21/16 2251  WBC 34.2* DUPLICATE  --   --  24.8*  --   CREATININE 0.87  --   --   --  0.76  --   LATICACIDVEN  --   --  2.38* 1.07 1.1 2.3*    Estimated Creatinine Clearance: 100.9 mL/min (by C-G formula based on SCr of 0.76 mg/dL).    No Known Allergies  Abran DukeLedford, Bobie Kistler 04/22/2016 12:00 AM

## 2016-04-22 NOTE — Progress Notes (Signed)
CRITICAL VALUE ALERT  Critical value received: HGB 6.7   Date of notification:  04/22/2016  Time of notification:  11:37  Critical value read back:Yes.    Nurse who received alert:  Glade LloydMelissa Snow, RN  MD notified (1st page):  Edward JollySilva, MD   Time of first page:  11:41   Responding MD: Edward JollySilva, MD  Time MD responded:  12:50

## 2016-04-22 NOTE — Consult Note (Signed)
Reason for Referral: Elevated bilirubin and splenomegaly.   HPI: 20 year old woman without any significant comorbid conditions that developed acute illness in the last 2-3 days. She started developing symptoms of abdominal pain, nausea and vomiting and temperature of 102.. She denied contacts or previous illnesses. She was seen at [facility] on 04/21/2016. She had a chest x-ray which was unremarkable. CT scan of the abdomen and pelvis showed findings consistent of pyelonephritis without any renal mass or lesions. She also found to have prominent spleen with volume close to 600 cubic cm. her initial laboratory data showed an elevated total bilirubin to 10.4, white cell count of 34,000 and hemoglobin of 11. She had normal platelet count. A repeat labs on 04/21/2016 showed a total bilirubin of 7.0 with split between direct and indirect bilirubin that is close to even. Her LDH was mildly elevated at 237. Her DAT is negative. She had mild elevation in both PT and PTT. Repeat laboratory testing on 04/22/2016 showed her hemoglobin is down to 7 but her white cell count of also decreased to 18,000 on pelvic and 158. Her total bilirubin now is 5.5. She had normal electrolytes and kidney function. Her urinalysis showed numerous white cell count and bacteria.  Clinically, she still complaining of abdominal pain as predominantly epigastric with some flank tenderness. She denied any nausea or vomiting currently. She denied any sick contacts or progressive weight loss or constitutional symptoms leading up to this episode.  She does report headaches but no blurry vision, syncope or seizures. She did report fevers but no sweats or weight loss. She is not reporting any chest pain, palpitation, orthopnea or leg edema. She does not report any cough, wheezing or hemoptysis. She does not report any hematochezia or melena. She did not report any frequency, urgency, dysuria or hematuria. She did report diffuse arthralgias  and myalgias. Remaining review of systems unremarkable.   Past Medical History:  Diagnosis Date  . Blood transfusion without reported diagnosis   . UTI (urinary tract infection)   :  Past Surgical History:  Procedure Laterality Date  . APPENDECTOMY    :   Current Facility-Administered Medications:  .  0.9 % NaCl with KCl 20 mEq/ L  infusion, , Intravenous, Continuous, Doreatha Lew, MD, Last Rate: 125 mL/hr at 04/22/16 0820, 125 mL/hr at 04/22/16 0820 .  acetaminophen (TYLENOL) tablet 650 mg, 650 mg, Oral, Q6H PRN, 650 mg at 04/21/16 1725 **OR** acetaminophen (TYLENOL) suppository 650 mg, 650 mg, Rectal, Q6H PRN, Kathie Dike, MD, 650 mg at 04/21/16 2332 .  enoxaparin (LOVENOX) injection 40 mg, 40 mg, Subcutaneous, Q24H, Kathie Dike, MD .  ketorolac (TORADOL) 15 MG/ML injection 15 mg, 15 mg, Intravenous, Q8H PRN, Doreatha Lew, MD .  magnesium sulfate IVPB 2 g 50 mL, 2 g, Intravenous, Once, Doreatha Lew, MD, 2 g at 04/22/16 1039 .  morphine 2 MG/ML injection 2 mg, 2 mg, Intravenous, Q4H PRN, Doreatha Lew, MD, 2 mg at 04/22/16 0826 .  ondansetron (ZOFRAN) tablet 4 mg, 4 mg, Oral, Q6H PRN **OR** ondansetron (ZOFRAN) injection 4 mg, 4 mg, Intravenous, Q6H PRN, Kathie Dike, MD, 4 mg at 04/22/16 0825 .  piperacillin-tazobactam (ZOSYN) IVPB 3.375 g, 3.375 g, Intravenous, Q8H, Erenest Blank, RPH, 3.375 g at 04/22/16 0308 .  sodium chloride 0.9 % bolus 1,000 mL, 1,000 mL, Intravenous, Once **AND** sodium chloride 0.9 % bolus 1,000 mL, 1,000 mL, Intravenous, Once **AND** sodium chloride 0.9 % bolus 250 mL, 250 mL, Intravenous,  Once, Kathie Dike, MD .  vancomycin (VANCOCIN) IVPB 750 mg/150 ml premix, 750 mg, Intravenous, Q8H, Erenest Blank, RPH, 750 mg at 04/22/16 3267:  No Known Allergies:  History reviewed. No pertinent family history.:  Social History   Social History  . Marital status: Single    Spouse name: N/A  . Number of children: N/A  . Years of  education: N/A   Occupational History  . Not on file.   Social History Main Topics  . Smoking status: Former Research scientist (life sciences)  . Smokeless tobacco: Never Used  . Alcohol use No  . Drug use:     Types: Marijuana  . Sexual activity: Not on file   Other Topics Concern  . Not on file   Social History Narrative  . No narrative on file  :  Pertinent items are noted in HPI.  Exam: Blood pressure (!) 112/54, pulse (!) 124, temperature (!) 101.8 F (38.8 C), temperature source Oral, resp. rate (!) 39, height _0  (1.651 m), weight 147 lb 14.9 oz (67.1 kg), last menstrual period 04/21/2016, SpO2 100 %. General appearance: alert and mild distress Sclerae mildly icteric. Throat: lips, mucosa, and tongue normal; teeth and gums normal Neck: no adenopathy Back: negative Resp: clear to auscultation bilaterally Cardio: regular rate and rhythm, S1, S2 normal, no murmur, click, rub or gallop GI: soft, tender on palpation. No rebound or guarding. Extremities: extremities normal, atraumatic, no cyanosis or edema Pulses: 2+ and symmetric   Recent Labs  04/21/16 1907 04/22/16 0510  WBC 24.8* 18.4*  HGB 7.3* 7.0*  HCT 20.5* 20.4*  PLT 170 158    Recent Labs  04/21/16 1147 04/21/16 1907 04/22/16 0510  NA 136  --  138  K 2.7*  --  3.4*  CL 100*  --  112*  CO2 21*  --  18*  GLUCOSE 122*  --  112*  BUN 20  --  9  CREATININE 0.87 0.76 0.74  CALCIUM 9.3  --  7.9*     Blood smear review: Peripheral smear was personally reviewed today and showed a few red cell fragments and polychromasia. No evidence of dysplasia, white cell blasts.   Ct Abdomen Pelvis W Contrast  Result Date: 04/21/2016 CLINICAL DATA:  Vomiting for 2 days, body aches, fever, headache, former smoker EXAM: CT ABDOMEN AND PELVIS WITH CONTRAST TECHNIQUE: Multidetector CT imaging of the abdomen and pelvis was performed using the standard protocol following bolus administration of intravenous contrast. Sagittal and coronal MPR  images reconstructed from axial data set. CONTRAST:  180m ISOVUE-300 IOPAMIDOL (ISOVUE-300) INJECTION 61% IV. Oral contrast was not administered. COMPARISON:  None FINDINGS: Lower chest: Lung bases clear Hepatobiliary: Calcified gallstones in gallbladder largest 1.5 cm diameter. Liver unremarkable. Pancreas: Normal appearance Spleen: Prominent spleen, 14.8 x 5.4 x 14.3 cm (volume = 600 cm^3) without focal lesion Adrenals/Urinary Tract: Patchy LEFT nephrogram most consistent with pyelonephritis. No urinary tract calcification or dilatation. No renal mass lesions. Ureters and bladder normal appearance. Stomach/Bowel: Appendix not definitely visualized. Suboptimal assessment of stomach and bowel loops due to lack of GI contrast and adequate distention, no gross abnormality seen Vascular/Lymphatic: Vascular structures unremarkable. Few normal sized LEFT para-aortic lymph nodes. Reproductive: Unremarkable Other: No free air free fluid.  No hernia. Musculoskeletal: Bones unremarkable. IMPRESSION: Patchy LEFT nephrogram consistent with pyelonephritis, recommend correlation with urinalysis. Cholelithiasis. Electronically Signed   By: MLavonia DanaM.D.   On: 04/21/2016 15:13   Dg Chest Portable 1 View  Result Date: 04/21/2016 CLINICAL  DATA:  Code sepsis EXAM: PORTABLE CHEST 1 VIEW COMPARISON:  None. FINDINGS: Normal heart size. Lungs clear. No pneumothorax. No pleural effusion. IMPRESSION: No active disease. Electronically Signed   By: Marybelle Killings M.D.   On: 04/21/2016 13:07    Assessment and Plan:   20 year old woman with the following issues:  1. Elevated total bilirubin with increase of both conjugate an unconjugated fractionation. Her total bilirubin at the time of presentation was 10.4 and currently trending down to 5.5. She did have a mild elevation in her LDH to 237 with negative DAT. Her peripheral smear did show evidence of red cell fragments with evidence of mild hemolysis is evident and polychromasia  and some spherocytes.  This presentation is likely as a consequences of acute sepsis related to her pyelonephritis. She has no findings to suggest autoimmune hemolysis given her negative DAT. Her haptoglobin will probably be low because of mild hemolysis that she is experiencing.  I doubt that there is a primary hematological disorder at this time but continuing monitoring would be necessary at this time. I agree with the current management with supportive care with hydration and antibiotics and monitor her bilirubin moving forward.  2. Anemia: She has an element of hemolysis that is nonimmune mediated likely related to sepsis. She also have myelosuppression related to sepsis itself. Supportive care is recommended at this time. She does not have any signs and symptoms of bleeding at this time. I have no objections to red cell transfusion if she becomes symptomatic from her anemia.  3. Splenomegaly: Unclear etiology at this time. Her CT scan of abdomen and pelvis did not show any lymphadenopathy to suggest lymphoma. Myeloproliferative disorder would be considered unlikely given her age and normal counts otherwise. Acute sepsis can certainly result in mild splenomegaly which is the likely etiology.  Repeat imaging studies in the future may be needed to ensure resolution of her splenomegaly once this acute sepsis episode resolves. I do not see a need for a bone marrow biopsy for further hematological workup at this time.  4. Pyelonephritis with sepsis: Management is per the primary team and currently on broad-spectrum antibiotics with vancomycin and Zosyn.  I will continue to follow her labs periodically and please call with any questions regarding this pleasant woman.

## 2016-04-23 ENCOUNTER — Inpatient Hospital Stay (HOSPITAL_COMMUNITY): Payer: Medicaid Other

## 2016-04-23 LAB — COMPREHENSIVE METABOLIC PANEL
ALT: 15 U/L (ref 14–54)
AST: 22 U/L (ref 15–41)
Albumin: 2.6 g/dL — ABNORMAL LOW (ref 3.5–5.0)
Alkaline Phosphatase: 40 U/L (ref 38–126)
Anion gap: 4 — ABNORMAL LOW (ref 5–15)
BILIRUBIN TOTAL: 3.2 mg/dL — AB (ref 0.3–1.2)
BUN: 5 mg/dL — AB (ref 6–20)
CHLORIDE: 109 mmol/L (ref 101–111)
CO2: 24 mmol/L (ref 22–32)
CREATININE: 0.6 mg/dL (ref 0.44–1.00)
Calcium: 8 mg/dL — ABNORMAL LOW (ref 8.9–10.3)
Glucose, Bld: 116 mg/dL — ABNORMAL HIGH (ref 65–99)
Potassium: 3.2 mmol/L — ABNORMAL LOW (ref 3.5–5.1)
Sodium: 137 mmol/L (ref 135–145)
TOTAL PROTEIN: 5.9 g/dL — AB (ref 6.5–8.1)

## 2016-04-23 LAB — CBC WITH DIFFERENTIAL/PLATELET
BASOS ABS: 0 10*3/uL (ref 0.0–0.1)
BASOS PCT: 0 %
EOS ABS: 0 10*3/uL (ref 0.0–0.7)
EOS PCT: 0 %
HCT: 21.1 % — ABNORMAL LOW (ref 36.0–46.0)
HEMOGLOBIN: 7.6 g/dL — AB (ref 12.0–15.0)
Lymphocytes Relative: 12 %
Lymphs Abs: 1.2 10*3/uL (ref 0.7–4.0)
MCH: 28.7 pg (ref 26.0–34.0)
MCHC: 36 g/dL (ref 30.0–36.0)
MCV: 79.6 fL (ref 78.0–100.0)
Monocytes Absolute: 0.7 10*3/uL (ref 0.1–1.0)
Monocytes Relative: 7 %
NEUTROS PCT: 81 %
Neutro Abs: 8.4 10*3/uL — ABNORMAL HIGH (ref 1.7–7.7)
PLATELETS: 183 10*3/uL (ref 150–400)
RBC: 2.65 MIL/uL — ABNORMAL LOW (ref 3.87–5.11)
RDW: 17.4 % — ABNORMAL HIGH (ref 11.5–15.5)
WBC: 10.3 10*3/uL (ref 4.0–10.5)

## 2016-04-23 LAB — C DIFFICILE QUICK SCREEN W PCR REFLEX
C DIFFICILE (CDIFF) INTERP: NOT DETECTED
C DIFFICILE (CDIFF) TOXIN: NEGATIVE
C Diff antigen: NEGATIVE

## 2016-04-23 LAB — IRON AND TIBC
IRON: 22 ug/dL — AB (ref 28–170)
SATURATION RATIOS: 13 % (ref 10.4–31.8)
TIBC: 168 ug/dL — AB (ref 250–450)
UIBC: 146 ug/dL

## 2016-04-23 LAB — HAPTOGLOBIN: HAPTOGLOBIN: 93 mg/dL (ref 34–200)

## 2016-04-23 LAB — RETICULOCYTES
RBC.: 2.74 MIL/uL — AB (ref 3.87–5.11)
Retic Count, Absolute: 169.9 10*3/uL (ref 19.0–186.0)
Retic Ct Pct: 6.2 % — ABNORMAL HIGH (ref 0.4–3.1)

## 2016-04-23 LAB — FERRITIN: Ferritin: 467 ng/mL — ABNORMAL HIGH (ref 11–307)

## 2016-04-23 LAB — VITAMIN B12: VITAMIN B 12: 296 pg/mL (ref 180–914)

## 2016-04-23 LAB — URINE CULTURE

## 2016-04-23 LAB — LACTATE DEHYDROGENASE: LDH: 243 U/L — AB (ref 98–192)

## 2016-04-23 LAB — FOLATE: FOLATE: 17.7 ng/mL (ref >5.9)

## 2016-04-23 LAB — LIPASE, BLOOD: LIPASE: 17 U/L (ref 11–51)

## 2016-04-23 LAB — AMYLASE: Amylase: 34 U/L (ref 28–100)

## 2016-04-23 MED ORDER — SODIUM CHLORIDE 0.9 % IV SOLN
1.0000 g | Freq: Three times a day (TID) | INTRAVENOUS | Status: DC
  Administered 2016-04-23 – 2016-04-25 (×7): 1 g via INTRAVENOUS
  Filled 2016-04-23 (×8): qty 1

## 2016-04-23 MED ORDER — SODIUM CHLORIDE 0.45 % IV SOLN
INTRAVENOUS | Status: DC
  Administered 2016-04-23 – 2016-04-25 (×4): via INTRAVENOUS
  Filled 2016-04-23 (×8): qty 1000

## 2016-04-23 MED ORDER — ACETAMINOPHEN 10 MG/ML IV SOLN
1000.0000 mg | Freq: Four times a day (QID) | INTRAVENOUS | Status: DC | PRN
  Administered 2016-04-23 – 2016-04-24 (×2): 1000 mg via INTRAVENOUS
  Filled 2016-04-23 (×6): qty 100

## 2016-04-23 NOTE — Progress Notes (Signed)
Events in the last 24 hours noted. No major changes clinically.  She received one unit of red cells with appropriate increase in her Hgb from 6.7 to 7.8. Her hgb today is 7.6.   LDH still elevated at 243.  Haptoglobin is still pending.  T. Bili is improving down to 3.2.  WBC is down as well.    Impression and Plan;  1. Anemia: likely multifactorial due menstrual losses, sepsis and low grade non-immune hemolysis. I agree with the current management with supportive transfusion as needed. Hgb is stable.   I will continue to follow labs.   2. Splenomegaly: I doubt a hematological disorder and likely related to sepsis. Follow up imaging studies in the future is recommend once this episode resolved.   3. Elevated bilirubin: related to mild hemolysis and sepsis. Appears to be improving.   4. Urosepsis: on broad spectrum antibiotics. Cultures are pending so far. Still having fevers. US to rule out abscesses is pending.    [facility] Hospital[facility]AD[facility],Hatem Cull MD 04/23/2016.

## 2016-04-23 NOTE — Progress Notes (Addendum)
PROGRESS NOTE [facility]   Sara Roth   XLK:[id number] DOB: 07/22/1995  DOA: 04/21/2016 PCP: No PCP Per Patient   Brief Narrative: Sara Roth is a 20 y.o. female with no significant past medical history presents to ED with a 2 day history of vomiting. She reports that over the past few days she's experienced diffuse myalgias, persistent vomiting, unable to keep anything down, no diarrhea, right upper quadrant abdominal pain. She reports that her emesis is orange and occasionally green in color. She is unsure whether she's had any fever, but has had chills. She's felt increasingly weak. She reports being told that she has a urinary tract infection a few weeks ago and was prescribed a course of antibiotics. She ended up filling this prescription the day before yesterday due to financial issues. She denies any sick contacts. She often eats at [employer] since she is an employee there. Denies any sore throat, cough, shortness of breath, URI type symptoms.  Subjective: Patient seen and examined. C/o headaches and abdominal pain. Good urine output. Continues with fever Tmax 103.1. Poor appetite.  Assessment & Plan: Sepsis 2/2 pyelonephritis - s/p ~ 5L of IVF, Lactic acid and WBC trending down, continues to be febrile  Will escalate abx change zosyn to meropenem  Get renal US r/o abscess  Continue IVF  F/u urine cultures  Repeat blood cultures  Supportive treatment for fever  Continue to monitor in SDU   Anemia - unknown etiology at this time, patient in her menstrual period but doubt this only cause, probably some component of hemodilution as well given aggressive hydration.  Hb base line ~ 11 current Hb 6.7. Also hemolysis could be playing part as well, LDH mild elevated - 2/2 to sepsis. S/p 1 PRBC Hb trend up to 7.6 Monitor CBC  Anemia panel pending  Transfuse if Hb < 7  Heme recommendations appreciated   Elevated total bili  Splenomegaly  Per heme and GI likely 2/2 to septic  process, bili trending down will continue to monitor GI recommendations appreciated    Hypokalemia - likely 2/2 to diarrhea patient reports having loose BM since yesterday  Change fluid to 1/2 NS with KCL Check BMP in AM   Diarrhea  Check stools panel  Check Cdiff given ? Hx of patient been in abx PTA   DVT prophylaxis: SCD's Code Status: FULL  Family Communication: None at bedside  Disposition Plan: Home when medically stable   Consultants:   GI   HEME  Procedures:   None   Antimicrobials:  Zosyn 12/23  Vanco 12/23   Objective: Vitals:   04/23/16 0000 04/23/16 0400 04/23/16 0600 04/23/16 0748  BP: 117/78 118/81  119/77  Pulse: (!) 103 (!) 104 (!) 120 (!) 121  Resp: 16 (!) 33 18 20  Temp:  (!) 100.6 F (38.1 C)  (!) 103.1 F (39.5 C)  TempSrc:  Oral  Oral  SpO2: 99% 97% 99% 100%  Weight:      Height:        Intake/Output Summary (Last 24 hours) at 04/23/16 0808 Last data filed at 04/23/16 0750  Gross per 24 hour  Intake          4936.67 ml  Output             2750 ml  Net          2186.67 ml   Filed Weights   04/21/16 1127 04/21/16 2000  Weight: 67.1 kg (148 lb) 67.1 kg (147 lb  14.9 oz)    Examination:  General exam:  Mild scleral icterus, Mild distress due abdominal pain and headaches    Respiratory system: Clear to auscultation. No wheezes,crackle or rhonchi Cardiovascular system: S1S2 no murmurs  Gastrointestinal system: LLQ tenderness, Left CVA tenderness No guarding  Central nervous system: Alert and oriented.  Extremities: No pedal edema.     Data Reviewed: I have personally reviewed following labs and imaging studies  CBC:  Recent Labs Lab 04/21/16 1147 04/21/16 1216 04/21/16 1907 04/22/16 0510 04/22/16 1102 04/22/16 2042 04/23/16 0236  WBC 34.2* DUPLICATE 24.8* 18.4* 15.2*  --  10.3  NEUTROABS 29.9* PENDING  --   --   --   --  8.4*  HGB 11.2* DUPLICATE 7.3* 7.0* 6.7* 7.8* 7.6*  HCT 31.4* DUPLICATE 20.5* 20.4* 19.0* 21.9*  21.1*  MCV 81.3 DUPLICATE 81.7 82.3 81.9  --  79.6  PLT 207 DUPLICATE 170 158 173  --  183   Basic Metabolic Panel:  Recent Labs Lab 04/21/16 1147 04/21/16 1907 04/22/16 0510 04/22/16 1313 04/23/16 0236  NA 136  --  138 134* 137  K 2.7*  --  3.4* 3.1* 3.2*  CL 100*  --  112* 110 109  CO2 21*  --  18* 18* 24  GLUCOSE 122*  --  112* 113* 116*  BUN 20  --  9 5* 5*  CREATININE 0.87 0.76 0.74 0.73 0.60  CALCIUM 9.3  --  7.9* 7.7* 8.0*  MG  --  1.6*  --   --   --    GFR: Estimated Creatinine Clearance: 100.9 mL/min (by C-G formula based on SCr of 0.6 mg/dL). Liver Function Tests:  Recent Labs Lab 04/21/16 1147 04/21/16 1644 04/22/16 0510 04/23/16 0236  AST 28 21 23 22   ALT 19 14 14 15   ALKPHOS 56 44 41 40  BILITOT 10.4* 7.0* 5.5* 3.2*  PROT 9.4* 7.3 6.3* 5.9*  ALBUMIN 4.5 3.4* 2.9* 2.6*    Recent Labs Lab 04/21/16 1147  LIPASE 19   No results for input(s): AMMONIA in the last 168 hours. Coagulation Profile:  Recent Labs Lab 04/21/16 1907  INR 1.53   Cardiac Enzymes: No results for input(s): CKTOTAL, CKMB, CKMBINDEX, TROPONINI in the last 168 hours. BNP (last 3 results) No results for input(s): PROBNP in the last 8760 hours. HbA1C: No results for input(s): HGBA1C in the last 72 hours. CBG: No results for input(s): GLUCAP in the last 168 hours. Lipid Profile: No results for input(s): CHOL, HDL, LDLCALC, TRIG, CHOLHDL, LDLDIRECT in the last 72 hours. Thyroid Function Tests: No results for input(s): TSH, T4TOTAL, FREET4, T3FREE, THYROIDAB in the last 72 hours. Anemia Panel: No results for input(s): VITAMINB12, FOLATE, FERRITIN, TIBC, IRON, RETICCTPCT in the last 72 hours. Sepsis Labs:  Recent Labs Lab 04/21/16 1707 04/21/16 1907 04/21/16 2251 04/22/16 0510  PROCALCITON  --  1.64  --   --   LATICACIDVEN 1.07 1.1 2.3* 1.4    Recent Results (from the past 240 hour(s))  Blood Culture (routine x 2)     Status: None (Preliminary result)   Collection  Time: 04/21/16 11:45 AM  Result Value Ref Range Status   Specimen Description RIGHT ANTECUBITAL  Final   Special Requests BOTTLES DRAWN AEROBIC AND ANAEROBIC 6CC EACH  Final   Culture NO GROWTH < 24 HOURS  Final   Report Status PENDING  Incomplete  Blood Culture (routine x 2)     Status: None (Preliminary result)   Collection Time: 04/21/16  12:39 PM  Result Value Ref Range Status   Specimen Description LEFT ANTECUBITAL  Final   Special Requests BOTTLES DRAWN AEROBIC AND ANAEROBIC 6CC EACH  Final   Culture NO GROWTH < 24 HOURS  Final   Report Status PENDING  Incomplete  MRSA PCR Screening     Status: None   Collection Time: 04/21/16  4:44 PM  Result Value Ref Range Status   MRSA by PCR NEGATIVE NEGATIVE Final    Comment:        The GeneXpert MRSA Assay (FDA approved for NASAL specimens only), is one component of a comprehensive MRSA colonization surveillance program. It is not intended to diagnose MRSA infection nor to guide or monitor treatment for MRSA infections.   MRSA PCR Screening     Status: None   Collection Time: 04/21/16  7:00 PM  Result Value Ref Range Status   MRSA by PCR NEGATIVE NEGATIVE Final    Comment:        The GeneXpert MRSA Assay (FDA approved for NASAL specimens only), is one component of a comprehensive MRSA colonization surveillance program. It is not intended to diagnose MRSA infection nor to guide or monitor treatment for MRSA infections.        Radiology Studies: Ct Abdomen Pelvis W Contrast  Result Date: 04/21/2016 CLINICAL DATA:  Vomiting for 2 days, body aches, fever, headache, former smoker EXAM: CT ABDOMEN AND PELVIS WITH CONTRAST TECHNIQUE: Multidetector CT imaging of the abdomen and pelvis was performed using the standard protocol following bolus administration of intravenous contrast. Sagittal and coronal MPR images reconstructed from axial data set. CONTRAST:  100mL ISOVUE-300 IOPAMIDOL (ISOVUE-300) INJECTION 61% IV. Oral contrast  was not administered. COMPARISON:  None FINDINGS: Lower chest: Lung bases clear Hepatobiliary: Calcified gallstones in gallbladder largest 1.5 cm diameter. Liver unremarkable. Pancreas: Normal appearance Spleen: Prominent spleen, 14.8 x 5.4 x 14.3 cm (volume = 600 cm^3) without focal lesion Adrenals/Urinary Tract: Patchy LEFT nephrogram most consistent with pyelonephritis. No urinary tract calcification or dilatation. No renal mass lesions. Ureters and bladder normal appearance. Stomach/Bowel: Appendix not definitely visualized. Suboptimal assessment of stomach and bowel loops due to lack of GI contrast and adequate distention, no gross abnormality seen Vascular/Lymphatic: Vascular structures unremarkable. Few normal sized LEFT para-aortic lymph nodes. Reproductive: Unremarkable Other: No free air free fluid.  No hernia. Musculoskeletal: Bones unremarkable. IMPRESSION: Patchy LEFT nephrogram consistent with pyelonephritis, recommend correlation with urinalysis. Cholelithiasis. Electronically Signed   By: Ulyses SouthwardMark  Boles M.D.   On: 04/21/2016 15:13   Dg Chest Portable 1 View  Result Date: 04/21/2016 CLINICAL DATA:  Code sepsis EXAM: PORTABLE CHEST 1 VIEW COMPARISON:  None. FINDINGS: Normal heart size. Lungs clear. No pneumothorax. No pleural effusion. IMPRESSION: No active disease. Electronically Signed   By: Jolaine ClickArthur  Hoss M.D.   On: 04/21/2016 13:07     Scheduled Meds: . enoxaparin (LOVENOX) injection  40 mg Subcutaneous Q24H  . piperacillin-tazobactam (ZOSYN)  IV  3.375 g Intravenous Q8H  . sodium chloride  1,000 mL Intravenous Once   And  . sodium chloride  1,000 mL Intravenous Once   And  . sodium chloride  250 mL Intravenous Once  . vancomycin  750 mg Intravenous Q8H   Continuous Infusions: . 0.9 % NaCl with KCl 20 mEq / L 125 mL/hr at 04/23/16 0600     LOS: 2 days    Latrelle DodrillEdwin Silva, MD [facility] Pager [phone]  If 7PM-7AM, please contact night-coverage [email or web address] Password  [email or web address] 04/23/2016, 8:08 AM

## 2016-04-23 NOTE — Progress Notes (Signed)
MD paged to make aware pt's temp 102 and pt still experiencing n/v/d despite PRN's. Pt unable to take anything PO without immediately throwing up. MD to order IV tylenol and make pt NPO. Will continue to monitor and assess pt closely.

## 2016-04-23 NOTE — Progress Notes (Signed)
Pt requested IV pain medication stating she had a pain of 10 in her stomach. RN went and pulled out morphine but patient stated morphine makes her dizzy and will not take. This RN wasted 2mg  of morphine in sharps witnessed by Bronson CurbHannah Padgett.

## 2016-04-24 LAB — CBC
HCT: 21.1 % — ABNORMAL LOW (ref 36.0–46.0)
Hemoglobin: 7.6 g/dL — ABNORMAL LOW (ref 12.0–15.0)
MCH: 28.5 pg (ref 26.0–34.0)
MCHC: 36 g/dL (ref 30.0–36.0)
MCV: 79 fL (ref 78.0–100.0)
PLATELETS: 225 10*3/uL (ref 150–400)
RBC: 2.67 MIL/uL — ABNORMAL LOW (ref 3.87–5.11)
RDW: 18 % — AB (ref 11.5–15.5)
WBC: 15.1 10*3/uL — ABNORMAL HIGH (ref 4.0–10.5)

## 2016-04-24 LAB — BASIC METABOLIC PANEL
ANION GAP: 6 (ref 5–15)
CALCIUM: 7.9 mg/dL — AB (ref 8.9–10.3)
CO2: 20 mmol/L — ABNORMAL LOW (ref 22–32)
Chloride: 109 mmol/L (ref 101–111)
Creatinine, Ser: 0.62 mg/dL (ref 0.44–1.00)
GFR calc Af Amer: >60 mL/min (ref >60)
GLUCOSE: 121 mg/dL — AB (ref 65–99)
Potassium: 3.8 mmol/L (ref 3.5–5.1)
Sodium: 135 mmol/L (ref 135–145)

## 2016-04-24 LAB — HEPATIC FUNCTION PANEL
ALBUMIN: 2.5 g/dL — AB (ref 3.5–5.0)
ALT: 28 U/L (ref 14–54)
AST: 43 U/L — ABNORMAL HIGH (ref 15–41)
Alkaline Phosphatase: 49 U/L (ref 38–126)
Bilirubin, Direct: 2.4 mg/dL — ABNORMAL HIGH (ref 0.1–0.5)
Indirect Bilirubin: 2.6 mg/dL — ABNORMAL HIGH (ref 0.3–0.9)
TOTAL PROTEIN: 6.1 g/dL — AB (ref 6.5–8.1)
Total Bilirubin: 5 mg/dL — ABNORMAL HIGH (ref 0.3–1.2)

## 2016-04-24 MED ORDER — PROMETHAZINE HCL 25 MG/ML IJ SOLN
25.0000 mg | Freq: Four times a day (QID) | INTRAMUSCULAR | Status: DC | PRN
  Filled 2016-04-24: qty 1

## 2016-04-24 MED ORDER — FAMOTIDINE IN NACL 20-0.9 MG/50ML-% IV SOLN
20.0000 mg | Freq: Two times a day (BID) | INTRAVENOUS | Status: DC
  Administered 2016-04-24 – 2016-04-25 (×4): 20 mg via INTRAVENOUS
  Filled 2016-04-24 (×5): qty 50

## 2016-04-24 MED ORDER — ACETAMINOPHEN 10 MG/ML IV SOLN
1000.0000 mg | Freq: Four times a day (QID) | INTRAVENOUS | Status: AC
  Administered 2016-04-24 – 2016-04-25 (×4): 1000 mg via INTRAVENOUS
  Filled 2016-04-24 (×5): qty 100

## 2016-04-24 MED ORDER — ACETAMINOPHEN 10 MG/ML IV SOLN: 1000.0000 mg | Freq: Four times a day (QID) | INTRAVENOUS | Status: DC

## 2016-04-24 NOTE — Progress Notes (Signed)
PROGRESS NOTE [facility]   Sara Roth   ZOX:[id number] DOB: Roth  DOA: 04/21/2016 PCP: No PCP Per Patient   Brief Narrative: Sara Roth is a 20 y.o. female with no significant past medical history presents to ED with a 2 day history of vomiting. She reports that over the past few days she's experienced diffuse myalgias, persistent vomiting, unable to keep anything down, no diarrhea, right upper quadrant abdominal pain. She reports that her emesis is orange and occasionally green in color. She is unsure whether she's had any fever, but has had chills. She's felt increasingly weak. She reports being told that she has a urinary tract infection a few weeks ago and was prescribed a course of antibiotics. She ended up filling this prescription the day before yesterday due to financial issues. She denies any sick contacts. She often eats at [employer] since she is an employee there. Denies any sore throat, cough, shortness of breath, URI type symptoms.  Subjective: Patient seen and examined with nurse at bedside. Patient significantly better this morning although continues to be febrile MAXIMUM TEMPERATURE 101.8 overnight. Patient continues to complain of nausea and vomiting  Assessment & Plan: Sepsis 2/2 pyelonephritis - slowly improving Continue meropenem Can d/c Vanco given neg blood cultures  Renal ultrasound normal Continue IVF  Urine culture multiple species Blood culture 4 no growth up-to-date  Scheduled Tylenol IV for fever Continue to monitor in SDU   Anemia - iron deficiency, patient in her menstrual period but doubt this only cause, probably some component of hemodilution as well given aggressive hydration.  Hb base line ~ 11 current Hb 6.7. Also hemolysis could be playing part as well, LDH mild elevated - 2/2 to sepsis. S/p 1 PRBC -  Hbg remains stable Monitor CBC  We'll start iron supplement Transfuse if Hb < 7  Heme recommendations appreciated   Elevated  total bili  Splenomegaly  Per heme and GI likely 2/2 to septic process, bili trending down will continue to monitor GI recommendations appreciated    Hypokalemia - likely 2/2 to diarrhea, improved with fluids  Continue IV fluids with KCl Check BMP in AM   Diarrhea  Stools panel ending C. difficile negative  DVT prophylaxis: SCD's Code Status: FULL  Family Communication: None at bedside  Disposition Plan: Home when medically stable   Consultants:   GI   HEME  Procedures:   None   Antimicrobials:  Zosyn 12/23 -12/25  Vanco 12/23 - 12/25  Meropenem 12/25 -   Objective: Vitals:   04/24/16 0351 04/24/16 0400 04/24/16 0800 04/24/16 0805  BP: (!) 143/87 (!) 148/92 126/82 126/82  Pulse: (!) 124   (!) 111  Resp:  14 (!) 32   Temp: (!) 101.8 F (38.8 C)   98.8 F (37.1 C)  TempSrc: Oral   Oral  SpO2:    100%  Weight:      Height:        Intake/Output Summary (Last 24 hours) at 04/24/16 0828 Last data filed at 04/24/16 0800  Gross per 24 hour  Intake             2510 ml  Output                0 ml  Net             2510 ml   Filed Weights   04/21/16 1127 04/21/16 2000  Weight: 67.1 kg (148 lb) 67.1 kg (147 lb 14.9 oz)  Examination:  General exam:  NAD, sitting comfortable   Respiratory system: CTA no crackles  Cardiovascular system: S1S2 RRR no murmurs   Gastrointestinal system: Soft NT ND negative CVA tenderness  Central nervous system: Alert and oriented. Extremities: No pedal edema.    Data Reviewed: I have personally reviewed following labs and imaging studies  CBC:  Recent Labs Lab 04/21/16 1147 04/21/16 1216 04/21/16 1907 04/22/16 0510 04/22/16 1102 04/22/16 2042 04/23/16 0236  WBC 34.2* DUPLICATE 24.8* 18.4* 15.2*  --  10.3  NEUTROABS 29.9* PENDING  --   --   --   --  8.4*  HGB 11.2* DUPLICATE 7.3* 7.0* 6.7* 7.8* 7.6*  HCT 31.4* DUPLICATE 20.5* 20.4* 19.0* 21.9* 21.1*  MCV 81.3 DUPLICATE 81.7 82.3 81.9  --  79.6  PLT 207  DUPLICATE 170 158 173  --  183   Basic Metabolic Panel:  Recent Labs Lab 04/21/16 1147 04/21/16 1907 04/22/16 0510 04/22/16 1313 04/23/16 0236  NA 136  --  138 134* 137  K 2.7*  --  3.4* 3.1* 3.2*  CL 100*  --  112* 110 109  CO2 21*  --  18* 18* 24  GLUCOSE 122*  --  112* 113* 116*  BUN 20  --  9 5* 5*  CREATININE 0.87 0.76 0.74 0.73 0.60  CALCIUM 9.3  --  7.9* 7.7* 8.0*  MG  --  1.6*  --   --   --    GFR: Estimated Creatinine Clearance: 100.9 mL/min (by C-G formula based on SCr of 0.6 mg/dL). Liver Function Tests:  Recent Labs Lab 04/21/16 1147 04/21/16 1644 04/22/16 0510 04/23/16 0236  AST 28 21 23 22   ALT 19 14 14 15   ALKPHOS 56 44 41 40  BILITOT 10.4* 7.0* 5.5* 3.2*  PROT 9.4* 7.3 6.3* 5.9*  ALBUMIN 4.5 3.4* 2.9* 2.6*    Recent Labs Lab 04/21/16 1147 04/23/16 0236  LIPASE 19 17  AMYLASE  --  34   No results for input(s): AMMONIA in the last 168 hours. Coagulation Profile:  Recent Labs Lab 04/21/16 1907  INR 1.53   Cardiac Enzymes: No results for input(s): CKTOTAL, CKMB, CKMBINDEX, TROPONINI in the last 168 hours. BNP (last 3 results) No results for input(s): PROBNP in the last 8760 hours. HbA1C: No results for input(s): HGBA1C in the last 72 hours. CBG: No results for input(s): GLUCAP in the last 168 hours. Lipid Profile: No results for input(s): CHOL, HDL, LDLCALC, TRIG, CHOLHDL, LDLDIRECT in the last 72 hours. Thyroid Function Tests: No results for input(s): TSH, T4TOTAL, FREET4, T3FREE, THYROIDAB in the last 72 hours. Anemia Panel:  Recent Labs  04/23/16 1201  VITAMINB12 296  FOLATE 17.7  FERRITIN 467*  TIBC 168*  IRON 22*  RETICCTPCT 6.2*   Sepsis Labs:  Recent Labs Lab 04/21/16 1707 04/21/16 1907 04/21/16 2251 04/22/16 0510  PROCALCITON  --  1.64  --   --   LATICACIDVEN 1.07 1.1 2.3* 1.4    Recent Results (from the past 240 hour(s))  Blood Culture (routine x 2)     Status: None (Preliminary result)   Collection Time:  04/21/16 11:45 AM  Result Value Ref Range Status   Specimen Description RIGHT ANTECUBITAL  Final   Special Requests BOTTLES DRAWN AEROBIC AND ANAEROBIC 6CC EACH  Final   Culture NO GROWTH < 24 HOURS  Final   Report Status PENDING  Incomplete  Urine culture     Status: Abnormal   Collection Time: 04/21/16 11:47 AM  Result Value Ref Range Status   Specimen Description URINE, CLEAN CATCH  Final   Special Requests NONE  Final   Culture MULTIPLE SPECIES PRESENT, SUGGEST RECOLLECTION (A)  Final   Report Status 04/23/2016 FINAL  Final  Blood Culture (routine x 2)     Status: None (Preliminary result)   Collection Time: 04/21/16 12:39 PM  Result Value Ref Range Status   Specimen Description LEFT ANTECUBITAL  Final   Special Requests BOTTLES DRAWN AEROBIC AND ANAEROBIC 6CC EACH  Final   Culture NO GROWTH < 24 HOURS  Final   Report Status PENDING  Incomplete  MRSA PCR Screening     Status: None   Collection Time: 04/21/16  4:44 PM  Result Value Ref Range Status   MRSA by PCR NEGATIVE NEGATIVE Final    Comment:        The GeneXpert MRSA Assay (FDA approved for NASAL specimens only), is one component of a comprehensive MRSA colonization surveillance program. It is not intended to diagnose MRSA infection nor to guide or monitor treatment for MRSA infections.   MRSA PCR Screening     Status: None   Collection Time: 04/21/16  7:00 PM  Result Value Ref Range Status   MRSA by PCR NEGATIVE NEGATIVE Final    Comment:        The GeneXpert MRSA Assay (FDA approved for NASAL specimens only), is one component of a comprehensive MRSA colonization surveillance program. It is not intended to diagnose MRSA infection nor to guide or monitor treatment for MRSA infections.   C difficile quick scan w PCR reflex     Status: None   Collection Time: 04/23/16 10:47 AM  Result Value Ref Range Status   C Diff antigen NEGATIVE NEGATIVE Final   C Diff toxin NEGATIVE NEGATIVE Final   C Diff  interpretation No C. difficile detected.  Final      Radiology Studies: US Renal  Result Date: 04/23/2016 CLINICAL DATA:  Pyelonephritis EXAM: RENAL / URINARY TRACT ULTRASOUND COMPLETE COMPARISON:  04/21/2016 FINDINGS: Right Kidney: Length: 11.6 cm. Normal cortical thickness and echogenicity. No mass, hydronephrosis or shadowing calcification. Left Kidney: Length: 13.3 cm. Normal cortical thickness and echogenicity. No mass, hydronephrosis or shadowing calcification. Bladder: Appears normal for degree of bladder distention. Incidentally noted cholelithiasis. IMPRESSION: No renal sonographic abnormalities identified. Cholelithiasis. Electronically Signed   By: Ulyses Southward M.D.   On: 04/23/2016 12:51     Scheduled Meds: . acetaminophen  1,000 mg Intravenous Q6H  . famotidine (PEPCID) IV  20 mg Intravenous Q12H  . meropenem (MERREM) 1 GM IVPB  1 g Intravenous Q8H  . sodium chloride  1,000 mL Intravenous Once   And  . sodium chloride  1,000 mL Intravenous Once   And  . sodium chloride  250 mL Intravenous Once   Continuous Infusions: . sodium chloride 0.45 % with kcl 100 mL/hr at 04/24/16 0705     LOS: 3 days    Latrelle Dodrill, MD [facility] Pager [phone]  If 7PM-7AM, please contact night-coverage [email or web address] Password [facility] 04/24/2016, 8:28 AM

## 2016-04-24 NOTE — Progress Notes (Signed)
Patient called out stating her head was killing her rated pain score 10/10 in tears. Temp 101.24F HR 120's sustaining, R44. Tylenol 1000mg  given IV Stat and Morphine 2mg  given IV. Pain subsided to 5/10 after Morphine given. Patient stated she was having gas pains in stomach and her stomach was grueling. Offered some ice ships but patient refused stating it will make me sick again. She has taken nothing by mouth tonight since 7pm. She had several episodes of nausea, green bile looking and several loose stools thru out the shift. Zofran 4mg  given IV for her protection against nausea. Patient resting comfortable in bed with wet washcloth across her forehead. Will continue to monitor closely.

## 2016-04-25 ENCOUNTER — Other Ambulatory Visit: Payer: Self-pay

## 2016-04-25 ENCOUNTER — Inpatient Hospital Stay (HOSPITAL_COMMUNITY): Payer: Medicaid Other

## 2016-04-25 DIAGNOSIS — J189 Pneumonia, unspecified organism: Secondary | ICD-10-CM

## 2016-04-25 LAB — BASIC METABOLIC PANEL
Anion gap: 7 (ref 5–15)
BUN: 5 mg/dL — AB (ref 6–20)
CO2: 20 mmol/L — ABNORMAL LOW (ref 22–32)
CREATININE: 0.58 mg/dL (ref 0.44–1.00)
Calcium: 8.1 mg/dL — ABNORMAL LOW (ref 8.9–10.3)
Chloride: 107 mmol/L (ref 101–111)
GFR calc Af Amer: >60 mL/min (ref >60)
Glucose, Bld: 86 mg/dL (ref 65–99)
POTASSIUM: 3.6 mmol/L (ref 3.5–5.1)
SODIUM: 134 mmol/L — AB (ref 135–145)

## 2016-04-25 LAB — CBC WITH DIFFERENTIAL/PLATELET
BASOS PCT: 1 %
Basophils Absolute: 0.1 10*3/uL (ref 0.0–0.1)
EOS ABS: 0.1 10*3/uL (ref 0.0–0.7)
EOS PCT: 1 %
HCT: 21.9 % — ABNORMAL LOW (ref 36.0–46.0)
Hemoglobin: 7.6 g/dL — ABNORMAL LOW (ref 12.0–15.0)
LYMPHS ABS: 2.9 10*3/uL (ref 0.7–4.0)
Lymphocytes Relative: 20 %
MCH: 27.5 pg (ref 26.0–34.0)
MCHC: 34.7 g/dL (ref 30.0–36.0)
MCV: 79.3 fL (ref 78.0–100.0)
Monocytes Absolute: 1 10*3/uL (ref 0.1–1.0)
Monocytes Relative: 7 %
Neutro Abs: 10.3 10*3/uL — ABNORMAL HIGH (ref 1.7–7.7)
Neutrophils Relative %: 71 %
PLATELETS: 256 10*3/uL (ref 150–400)
RBC: 2.76 MIL/uL — AB (ref 3.87–5.11)
RDW: 18.9 % — ABNORMAL HIGH (ref 11.5–15.5)
WBC: 14.4 10*3/uL — AB (ref 4.0–10.5)

## 2016-04-25 LAB — HEPATIC FUNCTION PANEL
ALBUMIN: 2.5 g/dL — AB (ref 3.5–5.0)
ALT: 34 U/L (ref 14–54)
AST: 43 U/L — ABNORMAL HIGH (ref 15–41)
Alkaline Phosphatase: 52 U/L (ref 38–126)
Bilirubin, Direct: 1.7 mg/dL — ABNORMAL HIGH (ref 0.1–0.5)
Indirect Bilirubin: 2 mg/dL — ABNORMAL HIGH (ref 0.3–0.9)
TOTAL PROTEIN: 6.3 g/dL — AB (ref 6.5–8.1)
Total Bilirubin: 3.7 mg/dL — ABNORMAL HIGH (ref 0.3–1.2)

## 2016-04-25 MED ORDER — ALBUTEROL SULFATE (2.5 MG/3ML) 0.083% IN NEBU
2.5000 mg | INHALATION_SOLUTION | RESPIRATORY_TRACT | Status: DC | PRN
  Administered 2016-04-25: 2.5 mg via RESPIRATORY_TRACT
  Filled 2016-04-25: qty 3

## 2016-04-25 MED ORDER — PROMETHAZINE HCL 25 MG/ML IJ SOLN: 6.2500 mg | Freq: Four times a day (QID) | INTRAMUSCULAR | Status: DC | PRN

## 2016-04-25 MED ORDER — CIPROFLOXACIN HCL 500 MG PO TABS
500.0000 mg | ORAL_TABLET | Freq: Two times a day (BID) | ORAL | Status: DC
  Administered 2016-04-25: 500 mg via ORAL
  Filled 2016-04-25: qty 1

## 2016-04-25 MED ORDER — PIPERACILLIN-TAZOBACTAM 3.375 G IVPB
3.3750 g | Freq: Three times a day (TID) | INTRAVENOUS | Status: DC
  Administered 2016-04-25 – 2016-04-27 (×6): 3.375 g via INTRAVENOUS
  Filled 2016-04-25 (×7): qty 50

## 2016-04-25 NOTE — Progress Notes (Addendum)
PROGRESS NOTE [facility]   Sara Roth   ZOX:[id number] DOB: 1995/06/27  DOA: 04/21/2016 PCP: No PCP Per Patient   Brief Narrative: Sara Roth is a 20 y.o. female with no significant past medical history presents to ED with a 2 day history of vomiting. She reports that over the past few days she's experienced diffuse myalgias, persistent vomiting, unable to keep anything down, no diarrhea, right upper quadrant abdominal pain. She reports that her emesis is orange and occasionally green in color. She is unsure whether she's had any fever, but has had chills. She's felt increasingly weak. She reports being told that she has a urinary tract infection a few weeks ago and was prescribed a course of antibiotics. She ended up filling this prescription the day before yesterday due to financial issues. She denies any sick contacts. She often eats at [employer] since she is an employee there. Denies any sore throat, cough, shortness of breath, URI type symptoms.  Subjective: Complained of wheezing and dyspnea this morning without chest pain or cough.  Assessment & Plan: Sepsis 2/2 pyelonephritis - slowly improving Since admission, she has received Rocephin 1 dose, Zosyn and vancomycin (12/24-12/25) and meropenem 12/25-12/26. Due to concern for healthcare associated pneumonia, transitioned back to IV Zosyn on 12/27. MRSA screen negative. Blood cultures negative to date and hence vancomycin not reinitiated. Renal ultrasound normal Discontinue IV fluids. Sepsis physiology resolved Urine culture multiple species Blood culture 4 no growth up-to-date  Continue to monitor in SDU  Consider transitioning to oral levofloxacin when stable.  Possible healthcare associated pneumonia - On 12/27, patient complained of wheezing and dyspnea. Chest x-ray suggests possible right lower lobe pneumonia. Discussed with pharmacy and started Zosyn. Discontinued meropenem. Follow clinically.  Anemia - iron  deficiency, patient in her menstrual period but doubt this only cause, probably some component of hemodilution as well given aggressive hydration.  Hb base line ~ 11 current Hb 6.7. Also hemolysis could be playing part as well, LDH mild elevated - 2/2 to sepsis. S/p 1 PRBC -  Hbg remains stable Monitor CBC >hemoglobin stable in the mid 7 g per DL range for last few days. We'll start iron supplement Transfuse if Hb < 7  Heme recommendations appreciated   Elevated total bili  Splenomegaly  Per heme and GI likely 2/2 to septic process, bili trending down will continue to monitor GI recommendations appreciated    Hypokalemia - likely 2/2 to diarrhea, improved with fluids  Replaced  Diarrhea  C. difficile negative. Resolved.  DVT prophylaxis: SCD's Code Status: FULL  Family Communication: None at bedside . Patient declines MDs offer to speak with family. Disposition Plan: Home when medically stable   Consultants:   GI   HEME  Procedures:   None   Antimicrobials:  Zosyn 12/23 -12/25  Vanco 12/23 - 12/25  Meropenem 12/25 -discontinued  IV Zosyn 12/27 >   Objective: Vitals:   04/25/16 0600 04/25/16 0700 04/25/16 0800 04/25/16 1100  BP:  124/85  126/84  Pulse: 96 (!) 101 (!) 43 (!) 106  Resp: (!) 39 (!) 37 15 (!) 36  Temp:  98.4 F (36.9 C)  98.8 F (37.1 C)  TempSrc:  Oral  Oral  SpO2: 94% 95% 93% 100%  Weight:      Height:        Intake/Output Summary (Last 24 hours) at 04/25/16 1503 Last data filed at 04/25/16 0900  Gross per 24 hour  Intake  2250 ml  Output                0 ml  Net             2250 ml   Filed Weights   04/21/16 1127 04/21/16 2000  Weight: 67.1 kg (148 lb) 67.1 kg (147 lb 14.9 oz)    Examination:  General exam:  Pleasant young female lying propped up in bed with mild increased work of breathing. Respiratory system: Slightly diminished breath sounds in the bases with few basal crackles. Scattered bilateral few expiratory  rhonchi. Mild increased work of breathing.  Cardiovascular system: S1S2 RRR no murmurs  . No JVD or pedal edema. Telemetry: Sinus tachycardia in the low 100s-110s. Gastrointestinal system: Soft NT ND negative CVA tenderness  Central nervous system: Alert and oriented. No focal deficits. Extremities: No pedal edema.    Data Reviewed: I have personally reviewed following labs and imaging studies  CBC:  Recent Labs Lab 04/21/16 1147 04/21/16 1216  04/22/16 0510 04/22/16 1102 04/22/16 2042 04/23/16 0236 04/24/16 0831 04/25/16 0202  WBC 34.2* DUPLICATE  < > 18.4* 15.2*  --  10.3 15.1* 14.4*  NEUTROABS 29.9* PENDING  --   --   --   --  8.4*  --  10.3*  HGB 11.2* DUPLICATE  < > 7.0* 6.7* 7.8* 7.6* 7.6* 7.6*  HCT 31.4* DUPLICATE  < > 20.4* 19.0* 21.9* 21.1* 21.1* 21.9*  MCV 81.3 DUPLICATE  < > 82.3 81.9  --  79.6 79.0 79.3  PLT 207 DUPLICATE  < > 158 173  --  183 225 256  < > = values in this interval not displayed. Basic Metabolic Panel:  Recent Labs Lab 04/21/16 1907 04/22/16 0510 04/22/16 1313 04/23/16 0236 04/24/16 0831 04/25/16 0202  NA  --  138 134* 137 135 134*  K  --  3.4* 3.1* 3.2* 3.8 3.6  CL  --  112* 110 109 109 107  CO2  --  18* 18* 24 20* 20*  GLUCOSE  --  112* 113* 116* 121* 86  BUN  --  9 5* 5* <5* 5*  CREATININE 0.76 0.74 0.73 0.60 0.62 0.58  CALCIUM  --  7.9* 7.7* 8.0* 7.9* 8.1*  MG 1.6*  --   --   --   --   --    GFR: Estimated Creatinine Clearance: 100.9 mL/min (by C-G formula based on SCr of 0.58 mg/dL). Liver Function Tests:  Recent Labs Lab 04/21/16 1147 04/21/16 1644 04/22/16 0510 04/23/16 0236 04/24/16 0831  AST 28 21 23 22  43*  ALT 19 14 14 15 28   ALKPHOS 56 44 41 40 49  BILITOT 10.4* 7.0* 5.5* 3.2* 5.0*  PROT 9.4* 7.3 6.3* 5.9* 6.1*  ALBUMIN 4.5 3.4* 2.9* 2.6* 2.5*    Recent Labs Lab 04/21/16 1147 04/23/16 0236  LIPASE 19 17  AMYLASE  --  34   No results for input(s): AMMONIA in the last 168 hours. Coagulation  Profile:  Recent Labs Lab 04/21/16 1907  INR 1.53   Cardiac Enzymes: No results for input(s): CKTOTAL, CKMB, CKMBINDEX, TROPONINI in the last 168 hours. BNP (last 3 results) No results for input(s): PROBNP in the last 8760 hours. HbA1C: No results for input(s): HGBA1C in the last 72 hours. CBG: No results for input(s): GLUCAP in the last 168 hours. Lipid Profile: No results for input(s): CHOL, HDL, LDLCALC, TRIG, CHOLHDL, LDLDIRECT in the last 72 hours. Thyroid Function Tests: No results for input(s): TSH,  T4TOTAL, FREET4, T3FREE, THYROIDAB in the last 72 hours. Anemia Panel:  Recent Labs  04/23/16 1201  VITAMINB12 296  FOLATE 17.7  FERRITIN 467*  TIBC 168*  IRON 22*  RETICCTPCT 6.2*   Sepsis Labs:  Recent Labs Lab 04/21/16 1707 04/21/16 1907 04/21/16 2251 04/22/16 0510  PROCALCITON  --  1.64  --   --   LATICACIDVEN 1.07 1.1 2.3* 1.4    Recent Results (from the past 240 hour(s))  Blood Culture (routine x 2)     Status: None (Preliminary result)   Collection Time: 04/21/16 11:45 AM  Result Value Ref Range Status   Specimen Description RIGHT ANTECUBITAL  Final   Special Requests BOTTLES DRAWN AEROBIC AND ANAEROBIC 6CC EACH  Final   Culture NO GROWTH 4 DAYS  Final   Report Status PENDING  Incomplete  Urine culture     Status: Abnormal   Collection Time: 04/21/16 11:47 AM  Result Value Ref Range Status   Specimen Description URINE, CLEAN CATCH  Final   Special Requests NONE  Final   Culture MULTIPLE SPECIES PRESENT, SUGGEST RECOLLECTION (A)  Final   Report Status 04/23/2016 FINAL  Final  Blood Culture (routine x 2)     Status: None (Preliminary result)   Collection Time: 04/21/16 12:39 PM  Result Value Ref Range Status   Specimen Description LEFT ANTECUBITAL  Final   Special Requests BOTTLES DRAWN AEROBIC AND ANAEROBIC 6CC EACH  Final   Culture NO GROWTH 4 DAYS  Final   Report Status PENDING  Incomplete  MRSA PCR Screening     Status: None   Collection  Time: 04/21/16  4:44 PM  Result Value Ref Range Status   MRSA by PCR NEGATIVE NEGATIVE Final    Comment:        The GeneXpert MRSA Assay (FDA approved for NASAL specimens only), is one component of a comprehensive MRSA colonization surveillance program. It is not intended to diagnose MRSA infection nor to guide or monitor treatment for MRSA infections.   MRSA PCR Screening     Status: None   Collection Time: 04/21/16  7:00 PM  Result Value Ref Range Status   MRSA by PCR NEGATIVE NEGATIVE Final    Comment:        The GeneXpert MRSA Assay (FDA approved for NASAL specimens only), is one component of a comprehensive MRSA colonization surveillance program. It is not intended to diagnose MRSA infection nor to guide or monitor treatment for MRSA infections.   C difficile quick scan w PCR reflex     Status: None   Collection Time: 04/23/16 10:47 AM  Result Value Ref Range Status   C Diff antigen NEGATIVE NEGATIVE Final   C Diff toxin NEGATIVE NEGATIVE Final   C Diff interpretation No C. difficile detected.  Final  Culture, blood (Routine X 2) w Reflex to ID Panel     Status: None (Preliminary result)   Collection Time: 04/23/16 12:01 PM  Result Value Ref Range Status   Specimen Description BLOOD RIGHT ARM  Final   Special Requests IN PEDIATRIC BOTTLE 2CC`  Final   Culture NO GROWTH 2 DAYS  Final   Report Status PENDING  Incomplete  Culture, blood (Routine X 2) w Reflex to ID Panel     Status: None (Preliminary result)   Collection Time: 04/23/16 12:01 PM  Result Value Ref Range Status   Specimen Description BLOOD RIGHT HAND  Final   Special Requests IN PEDIATRIC BOTTLE [facility]  Final  Culture NO GROWTH 2 DAYS  Final   Report Status PENDING  Incomplete      Radiology Studies: Dg Chest 2 View  Result Date: 04/25/2016 CLINICAL DATA:  Shortness of breath, former smoking history EXAM: CHEST  2 VIEW COMPARISON:  Portable chest x-ray of 04/21/2016 FINDINGS: On the lateral view,  there are slightly prominent markings posteriorly suspicious for lower lobe pneumonia possibly medially at the right lung base. Followup chest x-ray is recommended. No pleural effusion is seen. Mediastinal and hilar contours are unremarkable. The heart is within normal limits in size. No bony abnormality is seen. IMPRESSION: Suspect patchy possibly right lower lobe pneumonia. Recommend followup. Electronically Signed   By: Dwyane Dee M.D.   On: 04/25/2016 09:08     Scheduled Meds: . famotidine (PEPCID) IV  20 mg Intravenous Q12H  . piperacillin-tazobactam (ZOSYN)  IV  3.375 g Intravenous Q8H  . sodium chloride  1,000 mL Intravenous Once   And  . sodium chloride  1,000 mL Intravenous Once   And  . sodium chloride  250 mL Intravenous Once   Continuous Infusions: . sodium chloride 0.45 % with kcl 100 mL/hr at 04/25/16 0656     LOS: 4 days    Obert Espindola, MD, FACP, FHM. [facility] Pager [phone]  If 7PM-7AM, please contact night-coverage [email or web address] Password [facility] 04/25/2016, 3:42 PM

## 2016-04-25 NOTE — Care Management Note (Signed)
Case Management Note  Patient Details  Name: Sara Roth MRN: [id number] Date of Birth: 09/26/1995  Subjective/Objective:    Sepsis s/t pyloenephritis, anemia, elevated total bilirubin,                 Action/Plan: Discharge Planning: NCM spoke to pt and mother at bedside. States she works full-time at [employer]. She is not aware if she has insurance. Contacted Artistinancial Counselor to follow up on her Medicaid Application. Pt states she completed. Explained to pt MATCH program. Will use MATCH for medication. [facility] Pt states she will need a note to return to work. Message left for attending.   [facility] arrive Monday 8, [facility] at 9:45 am. Provided pt with brochure.    Expected Discharge Date:             Expected Discharge Plan:  Home/Self Care  In-House Referral:  Financial Counselor  Discharge planning Services  CM Consult, MATCH Program, Medication Assistance  Post Acute Care Choice:  NA Choice offered to:  NA  DME Arranged:  N/A DME Agency:  NA  HH Arranged:  NA HH Agency:  NA  Status of Service:  Completed, signed off  If discussed at Long Length of Stay Meetings, dates discussed:    Additional Comments:  [name], [name], RN 04/25/2016, 12:41 PM

## 2016-04-26 LAB — CBC WITH DIFFERENTIAL/PLATELET
BASOS ABS: 0.1 10*3/uL (ref 0.0–0.1)
BASOS PCT: 1 %
EOS PCT: 2 %
Eosinophils Absolute: 0.3 10*3/uL (ref 0.0–0.7)
HEMATOCRIT: 23 % — AB (ref 36.0–46.0)
HEMOGLOBIN: 7.8 g/dL — AB (ref 12.0–15.0)
LYMPHS ABS: 3.9 10*3/uL (ref 0.7–4.0)
LYMPHS PCT: 26 %
MCH: 27.9 pg (ref 26.0–34.0)
MCHC: 33.9 g/dL (ref 30.0–36.0)
MCV: 82.1 fL (ref 78.0–100.0)
MONOS PCT: 6 %
Monocytes Absolute: 0.9 10*3/uL (ref 0.1–1.0)
Neutro Abs: 9.7 10*3/uL — ABNORMAL HIGH (ref 1.7–7.7)
Neutrophils Relative %: 65 %
Platelets: 315 10*3/uL (ref 150–400)
RBC: 2.8 MIL/uL — ABNORMAL LOW (ref 3.87–5.11)
RDW: 20.7 % — ABNORMAL HIGH (ref 11.5–15.5)
WBC: 14.9 10*3/uL — ABNORMAL HIGH (ref 4.0–10.5)

## 2016-04-26 LAB — COMPREHENSIVE METABOLIC PANEL
ALK PHOS: 49 U/L (ref 38–126)
ALT: 27 U/L (ref 14–54)
AST: 24 U/L (ref 15–41)
Albumin: 2.5 g/dL — ABNORMAL LOW (ref 3.5–5.0)
Anion gap: 6 (ref 5–15)
BILIRUBIN TOTAL: 2.1 mg/dL — AB (ref 0.3–1.2)
BUN: <5 mg/dL — ABNORMAL LOW (ref 6–20)
CALCIUM: 8.2 mg/dL — AB (ref 8.9–10.3)
CO2: 24 mmol/L (ref 22–32)
CREATININE: 0.58 mg/dL (ref 0.44–1.00)
Chloride: 105 mmol/L (ref 101–111)
GFR calc non Af Amer: >60 mL/min (ref >60)
Glucose, Bld: 98 mg/dL (ref 65–99)
Potassium: 3.1 mmol/L — ABNORMAL LOW (ref 3.5–5.1)
SODIUM: 135 mmol/L (ref 135–145)
TOTAL PROTEIN: 6.3 g/dL — AB (ref 6.5–8.1)

## 2016-04-26 LAB — TYPE AND SCREEN
ABO/RH(D): O POS
ANTIBODY SCREEN: NEGATIVE
UNIT DIVISION: 0
Unit division: 0
Unit division: 0

## 2016-04-26 LAB — MAGNESIUM: Magnesium: 1.8 mg/dL (ref 1.7–2.4)

## 2016-04-26 MED ORDER — POTASSIUM CHLORIDE CRYS ER 20 MEQ PO TBCR
40.0000 meq | EXTENDED_RELEASE_TABLET | Freq: Once | ORAL | Status: AC
  Administered 2016-04-26: 40 meq via ORAL

## 2016-04-26 MED ORDER — FAMOTIDINE 20 MG PO TABS
20.0000 mg | ORAL_TABLET | Freq: Two times a day (BID) | ORAL | Status: DC
  Administered 2016-04-26 – 2016-04-27 (×3): 20 mg via ORAL
  Filled 2016-04-26 (×2): qty 1

## 2016-04-26 NOTE — Progress Notes (Signed)
PROGRESS NOTE [facility]   Sara Greekmaree Timberman   ZOX:[id number] DOB: [birth year]  DOA: 04/21/2016 PCP: No PCP Per Patient   Brief Narrative: Sara Roth is a 20 y.o. female with no significant past medical history presents to ED with a 2 day history of vomiting. She reports that over the past few days she's experienced diffuse myalgias, persistent vomiting, unable to keep anything down, no diarrhea, right upper quadrant abdominal pain. She reports that her emesis is orange and occasionally green in color. She is unsure whether she's had any fever, but has had chills. She's felt increasingly weak. She reports being told that she has a urinary tract infection a few weeks ago and was prescribed a course of antibiotics. She ended up filling this prescription the day before yesterday due to financial issues. She denies any sick contacts. She often eats at [employer] since she is an employee there.   Subjective: Feels better today. No dyspnea, cough, wheezing or chest pain. As per RN, no acute issues. Patient interviewed in the presence of her female RN and agreed to have HIV testing done.  Assessment & Plan: Sepsis 2/2 pyelonephritis - slowly improving Since admission, she has received Rocephin 1 dose, Zosyn and vancomycin (12/24-12/25) and meropenem 12/25-12/26. Due to concern for healthcare associated pneumonia, transitioned back to IV Zosyn on 12/27. MRSA screen negative. Blood cultures negative to date and hence vancomycin not reinitiated. Renal ultrasound normal Discontinue IV fluids. Sepsis physiology resolved Urine culture multiple species Blood culture 4: no growth to date  Continue to monitor in SDU  Consider transitioning to oral levofloxacin on 12/29.  Possible healthcare associated pneumonia, RLL - On 12/27, patient complained of wheezing and dyspnea. Chest x-ray suggested possible right lower lobe pneumonia. Discussed with pharmacy and started Zosyn. Discontinued meropenem.  Follow clinically >improving. Consider transitioning to oral levofloxacin and discharged on 12/29.  Anemia - iron deficiency, patient in her menstrual period but doubt this only cause, probably some component of hemodilution as well given aggressive hydration.  Hb base line ~ 11 current Hb 6.7. Also hemolysis could be playing part as well, LDH mild elevated - 2/2 to sepsis. S/p 1 PRBC -  Hbg remains stable Monitor CBC >hemoglobin stable in the mid 7 g per DL range for last few days. We'll start iron supplement Transfuse if Hb < 7  Heme recommendations appreciated   Elevated total bili  Splenomegaly  Per heme and GI likely 2/2 to septic process, bili trending down will continue to monitor. Bilirubin 2.1 on 12/28. GI recommendations appreciated    Hypokalemia - likely 2/2 to diarrhea, improved with fluids  Replace and follow. Magnesium 1.3.  Diarrhea  C. difficile negative. Resolved.  DVT prophylaxis: SCD's Code Status: FULL  Family Communication: None at bedside . Patient declines MDs offer to speak with family. Disposition Plan: Home when medically stable , possibly 12/29  Consultants:   GI   HEME  Procedures:   None   Antimicrobials:  Zosyn 12/23 -12/25  Vanco 12/23 - 12/25  Meropenem 12/25 -discontinued  IV Zosyn 12/27 >   Objective: Vitals:   04/26/16 0453 04/26/16 0700 04/26/16 1100 04/26/16 1500  BP: 125/85 115/77 121/79 122/82  Pulse: 98 80 99 92  Resp:   15   Temp: 98.7 F (37.1 C) 98.6 F (37 C) 98.9 F (37.2 C) 98.7 F (37.1 C)  TempSrc: Oral Oral Oral Oral  SpO2: 96% 99%    Weight:      Height:  Intake/Output Summary (Last 24 hours) at 04/26/16 1629 Last data filed at 04/26/16 0510  Gross per 24 hour  Intake              390 ml  Output                0 ml  Net              390 ml   Filed Weights   04/21/16 1127 04/21/16 2000  Weight: 67.1 kg (148 lb) 67.1 kg (147 lb 14.9 oz)    Examination:  General exam:  Pleasant young  female lying propped up in bed. Looks improved compared to yesterday and is without any distress. Respiratory system: Much improved breath sounds without wheezing or rhonchi. Occasional basal crackles. No increased work of breathing. Cardiovascular system: S1S2 RRR no murmurs  . No JVD or pedal edema. Telemetry: Sinus Rythm intermittent mild sinus tachycardia in the low 100s/110s. Gastrointestinal system: Soft NT ND negative CVA tenderness  Central nervous system: Alert and oriented. No focal deficits. Extremities: No pedal edema.    Data Reviewed: I have personally reviewed following labs and imaging studies  CBC:  Recent Labs Lab 04/21/16 1147 04/21/16 1216  04/22/16 1102 04/22/16 2042 04/23/16 0236 04/24/16 0831 04/25/16 0202 04/26/16 0556  WBC 34.2* DUPLICATE  < > 15.2*  --  10.3 15.1* 14.4* 14.9*  NEUTROABS 29.9* PENDING  --   --   --  8.4*  --  10.3* 9.7*  HGB 11.2* DUPLICATE  < > 6.7* 7.8* 7.6* 7.6* 7.6* 7.8*  HCT 31.4* DUPLICATE  < > 19.0* 21.9* 21.1* 21.1* 21.9* 23.0*  MCV 81.3 DUPLICATE  < > 81.9  --  79.6 79.0 79.3 82.1  PLT 207 DUPLICATE  < > 173  --  183 225 256 315  < > = values in this interval not displayed. Basic Metabolic Panel:  Recent Labs Lab 04/21/16 1907  04/22/16 1313 04/23/16 0236 04/24/16 0831 04/25/16 0202 04/26/16 0556 04/26/16 1055  NA  --   < > 134* 137 135 134* 135  --   K  --   < > 3.1* 3.2* 3.8 3.6 3.1*  --   CL  --   < > 110 109 109 107 105  --   CO2  --   < > 18* 24 20* 20* 24  --   GLUCOSE  --   < > 113* 116* 121* 86 98  --   BUN  --   < > 5* 5* <5* 5* <5*  --   CREATININE 0.76  < > 0.73 0.60 0.62 0.58 0.58  --   CALCIUM  --   < > 7.7* 8.0* 7.9* 8.1* 8.2*  --   MG 1.6*  --   --   --   --   --   --  1.8  < > = values in this interval not displayed. GFR: Estimated Creatinine Clearance: 100.9 mL/min (by C-G formula based on SCr of 0.58 mg/dL). Liver Function Tests:  Recent Labs Lab 04/22/16 0510 04/23/16 0236 04/24/16 0831  04/25/16 0159 04/26/16 0556  AST 23 22 43* 43* 24  ALT 14 15 28  34 27  ALKPHOS 41 40 49 52 49  BILITOT 5.5* 3.2* 5.0* 3.7* 2.1*  PROT 6.3* 5.9* 6.1* 6.3* 6.3*  ALBUMIN 2.9* 2.6* 2.5* 2.5* 2.5*    Recent Labs Lab 04/21/16 1147 04/23/16 0236  LIPASE 19 17  AMYLASE  --  34   No  results for input(s): AMMONIA in the last 168 hours. Coagulation Profile:  Recent Labs Lab 04/21/16 1907  INR 1.53   Cardiac Enzymes: No results for input(s): CKTOTAL, CKMB, CKMBINDEX, TROPONINI in the last 168 hours. BNP (last 3 results) No results for input(s): PROBNP in the last 8760 hours. HbA1C: No results for input(s): HGBA1C in the last 72 hours. CBG: No results for input(s): GLUCAP in the last 168 hours. Lipid Profile: No results for input(s): CHOL, HDL, LDLCALC, TRIG, CHOLHDL, LDLDIRECT in the last 72 hours. Thyroid Function Tests: No results for input(s): TSH, T4TOTAL, FREET4, T3FREE, THYROIDAB in the last 72 hours. Anemia Panel: No results for input(s): VITAMINB12, FOLATE, FERRITIN, TIBC, IRON, RETICCTPCT in the last 72 hours. Sepsis Labs:  Recent Labs Lab 04/21/16 1707 04/21/16 1907 04/21/16 2251 04/22/16 0510  PROCALCITON  --  1.64  --   --   LATICACIDVEN 1.07 1.1 2.3* 1.4    Recent Results (from the past 240 hour(s))  Blood Culture (routine x 2)     Status: None (Preliminary result)   Collection Time: 04/21/16 11:45 AM  Result Value Ref Range Status   Specimen Description RIGHT ANTECUBITAL  Final   Special Requests BOTTLES DRAWN AEROBIC AND ANAEROBIC 6CC EACH  Final   Culture NO GROWTH 4 DAYS  Final   Report Status PENDING  Incomplete  Urine culture     Status: Abnormal   Collection Time: 04/21/16 11:47 AM  Result Value Ref Range Status   Specimen Description URINE, CLEAN CATCH  Final   Special Requests NONE  Final   Culture MULTIPLE SPECIES PRESENT, SUGGEST RECOLLECTION (A)  Final   Report Status 04/23/2016 FINAL  Final  Blood Culture (routine x 2)     Status: None  (Preliminary result)   Collection Time: 04/21/16 12:39 PM  Result Value Ref Range Status   Specimen Description LEFT ANTECUBITAL  Final   Special Requests BOTTLES DRAWN AEROBIC AND ANAEROBIC 6CC EACH  Final   Culture NO GROWTH 4 DAYS  Final   Report Status PENDING  Incomplete  MRSA PCR Screening     Status: None   Collection Time: 04/21/16  4:44 PM  Result Value Ref Range Status   MRSA by PCR NEGATIVE NEGATIVE Final    Comment:        The GeneXpert MRSA Assay (FDA approved for NASAL specimens only), is one component of a comprehensive MRSA colonization surveillance program. It is not intended to diagnose MRSA infection nor to guide or monitor treatment for MRSA infections.   MRSA PCR Screening     Status: None   Collection Time: 04/21/16  7:00 PM  Result Value Ref Range Status   MRSA by PCR NEGATIVE NEGATIVE Final    Comment:        The GeneXpert MRSA Assay (FDA approved for NASAL specimens only), is one component of a comprehensive MRSA colonization surveillance program. It is not intended to diagnose MRSA infection nor to guide or monitor treatment for MRSA infections.   C difficile quick scan w PCR reflex     Status: None   Collection Time: 04/23/16 10:47 AM  Result Value Ref Range Status   C Diff antigen NEGATIVE NEGATIVE Final   C Diff toxin NEGATIVE NEGATIVE Final   C Diff interpretation No C. difficile detected.  Final  Stool culture (children & immunocomp patients)     Status: None (Preliminary result)   Collection Time: 04/23/16 10:48 AM  Result Value Ref Range Status   Salmonella/Shigella Screen  PENDING  Incomplete   Campylobacter Culture PENDING  Incomplete   E coli, Shiga toxin Assay Negative Negative Final    Comment: (NOTE) Performed At: [facility] [address] [place] [id number] Mila HomerHancock William F MD NW:[phone]   Culture, blood (Routine X 2) w Reflex to ID Panel     Status: None (Preliminary result)   Collection Time:  04/23/16 12:01 PM  Result Value Ref Range Status   Specimen Description BLOOD RIGHT ARM  Final   Special Requests IN PEDIATRIC BOTTLE 2CC`  Final   Culture NO GROWTH 3 DAYS  Final   Report Status PENDING  Incomplete  Culture, blood (Routine X 2) w Reflex to ID Panel     Status: None (Preliminary result)   Collection Time: 04/23/16 12:01 PM  Result Value Ref Range Status   Specimen Description BLOOD RIGHT HAND  Final   Special Requests IN PEDIATRIC BOTTLE 2CC  Final   Culture NO GROWTH 3 DAYS  Final   Report Status PENDING  Incomplete      Radiology Studies: Dg Chest 2 View  Result Date: 04/25/2016 CLINICAL DATA:  Shortness of breath, former smoking history EXAM: CHEST  2 VIEW COMPARISON:  Portable chest x-ray of 04/21/2016 FINDINGS: On the lateral view, there are slightly prominent markings posteriorly suspicious for lower lobe pneumonia possibly medially at the right lung base. Followup chest x-ray is recommended. No pleural effusion is seen. Mediastinal and hilar contours are unremarkable. The heart is within normal limits in size. No bony abnormality is seen. IMPRESSION: Suspect patchy possibly right lower lobe pneumonia. Recommend followup. Electronically Signed   By: Dwyane DeePaul  Barry M.D.   On: 04/25/2016 09:08     Scheduled Meds: . famotidine  20 mg Oral BID  . piperacillin-tazobactam (ZOSYN)  IV  3.375 g Intravenous Q8H   Continuous Infusions:    LOS: 5 days    Hilario Robarts, MD, FACP, FHM. [facility] Pager [phone]  If 7PM-7AM, please contact night-coverage [email or web address] Password [email or web address] 04/26/2016, 4:29 PM

## 2016-04-27 DIAGNOSIS — J189 Pneumonia, unspecified organism: Secondary | ICD-10-CM

## 2016-04-27 LAB — CBC
HEMATOCRIT: 26.2 % — AB (ref 36.0–46.0)
HEMOGLOBIN: 8.8 g/dL — AB (ref 12.0–15.0)
MCH: 27.7 pg (ref 26.0–34.0)
MCHC: 33.6 g/dL (ref 30.0–36.0)
MCV: 82.4 fL (ref 78.0–100.0)
Platelets: 342 10*3/uL (ref 150–400)
RBC: 3.18 MIL/uL — AB (ref 3.87–5.11)
RDW: 22.1 % — AB (ref 11.5–15.5)
WBC: 15.6 10*3/uL — AB (ref 4.0–10.5)

## 2016-04-27 LAB — CULTURE, BLOOD (ROUTINE X 2)
CULTURE: NO GROWTH
CULTURE: NO GROWTH

## 2016-04-27 LAB — BASIC METABOLIC PANEL
ANION GAP: 10 (ref 5–15)
CO2: 21 mmol/L — ABNORMAL LOW (ref 22–32)
Calcium: 8.6 mg/dL — ABNORMAL LOW (ref 8.9–10.3)
Chloride: 108 mmol/L (ref 101–111)
Creatinine, Ser: 0.54 mg/dL (ref 0.44–1.00)
Glucose, Bld: 97 mg/dL (ref 65–99)
POTASSIUM: 3.3 mmol/L — AB (ref 3.5–5.1)
SODIUM: 139 mmol/L (ref 135–145)

## 2016-04-27 LAB — HIV ANTIBODY (ROUTINE TESTING W REFLEX): HIV SCREEN 4TH GENERATION: NONREACTIVE

## 2016-04-27 MED ORDER — POTASSIUM CHLORIDE CRYS ER 20 MEQ PO TBCR
40.0000 meq | EXTENDED_RELEASE_TABLET | Freq: Once | ORAL | Status: AC
  Administered 2016-04-27: 40 meq via ORAL
  Filled 2016-04-27: qty 2

## 2016-04-27 MED ORDER — LEVOFLOXACIN 750 MG PO TABS
750.0000 mg | ORAL_TABLET | Freq: Every day | ORAL | Status: DC
  Administered 2016-04-27: 750 mg via ORAL
  Filled 2016-04-27: qty 1

## 2016-04-27 MED ORDER — LEVOFLOXACIN 750 MG PO TABS: 750.0000 mg | ORAL_TABLET | Freq: Every day | ORAL | 0 refills | Status: AC

## 2016-04-27 MED ORDER — SALINE SPRAY 0.65 % NA SOLN: 1.0000 | NASAL | 0 refills | Status: AC | PRN

## 2016-04-27 MED ORDER — FERROUS SULFATE 325 (65 FE) MG PO TABS: 325.0000 mg | ORAL_TABLET | Freq: Two times a day (BID) | ORAL | 0 refills | Status: AC

## 2016-04-27 MED ORDER — DOCUSATE SODIUM 100 MG PO CAPS: 100.0000 mg | ORAL_CAPSULE | Freq: Two times a day (BID) | ORAL | 0 refills | Status: AC

## 2016-04-27 NOTE — Discharge Summary (Addendum)
Physician Discharge Summary  Burgandy Hackworth PYK:[id number] DOB: [birth year]  PCP: No PCP Per Patient  Admit date: 04/21/2016 Discharge date: 04/27/2016  Recommendations for Outpatient Follow-up:  1. Free clinic of [facility]/PCP on 05/08/15 at 9:45 AM. To be seen with repeat labs (CBC & BMP). 2. Recommend repeating chest x-ray in 3-4 weeks to ensure resolution of pneumonia findings. 3. Recommend abdominal imaging to follow up splenomegaly.  Home Health: None Equipment/Devices:  None   Discharge Condition: improved and stable  CODE STATUS: Full  Diet recommendation: regular diet  Discharge Diagnoses:  Active Problems:   UTI (urinary tract infection)   Pyelonephritis   Sepsis ([place])   Hypokalemia   Hyperbilirubinemia   Splenomegaly   HCAP (healthcare-associated pneumonia)   Brief/Interim Summary: Sara Roth a 20 y.o.femalewith no significant past medical history presented to ED with a 2 day history of vomiting. She reported that over the past few days she experienced diffuse myalgias, persistent vomiting, unable to keep anything down, no diarrhea, right upper quadrant abdominal pain. She reported that her emesis was orange and occasionally green in color. She is unsure whether she's had any fever, but had chills. She felt increasingly weak. She reported being told that she has a urinary tract infection a few weeks ago and was prescribed a course of antibiotics. She ended up filling this prescription 2 days before hospital admission, due to financial issues. She denied any sick contacts. She often eats at [facility] since she is an employee there. She initially presented to [facility] ED. In the ED, she met criteria for sepsis and had fever of 103F, tachycardic with heart rates in the 120s-160s, leukocytosis of 34K. Additionally she had elevated lactate of 2.3, Potassium 2.7, total bilirubin 10.4, urine microscopy suggestive of infection, influenza panel negative,  pregnancy test negative and CT abdomen and pelvis confirmed left-sided pyelonephritis and no reported biliary ductal dilatation. Due to hyper bilirubinemia and concern for other causes, her case was discussed with GI and hematology and patient was transferred to [facility] for further evaluation and management. She was evaluated by surgery at [facility] and it was not felt that she had cholecystitis.  Assessment & Plan: Sepsis 2/2 pyelonephritis  Since admission, she received Rocephin 1 dose, Zosyn and vancomycin (12/24-12/25) and meropenem 12/25-12/26. Due to concern for healthcare associated pneumonia, transitioned back to IV Zosyn on 12/27. MRSA screen negative. Blood cultures 4: negative.  Renal ultrasound normal Discontinued IV fluids. Sepsis physiology resolved Urine culture multiple species suggesting contamination On day of discharge, she was transitioned to oral levofloxacin to complete a total 7 days treatment which would mainly cover her healthcare associated pneumonia and pyelonephritis.  Possible healthcare associated pneumonia, RLL - On 12/27, patient complained of wheezing and dyspnea. Chest x-ray suggested possible right lower lobe pneumonia. Discussed with pharmacy and started Zosyn. Discontinued meropenem. She clinically improved and was transitioned to oral levofloxacin at discharge. Recommend repeating chest x-ray in 3-4 weeks to ensure resolution of pneumonia. This was discussed with patient and she verbalized understanding.  Anemia - iron deficiency, patient was in her menstrual period but doubt this as the only cause, probably some component of hemodilution as well given aggressive hydration.  Hb base line ~ 11 and became Hb 6.7. Also hemolysis could be playing part as well, LDH mild elevated - 2/2 to sepsis. S/p 1 PRBC -  Hbg remains stable Posttransfusion, hemoglobin remained stable. Iron supplements were started at discharge along with stool softeners.  Heme  recommendations  appreciated >anemia likely multifactorial due to menstrual loss, sepsis and low grade nonimmune hemolysis. Recommended supportive transfusion.  Patient was counseled that she should follow up with an OB/GYN related to her intermittent heavy menses and she verbalized understanding. Anemia panel: Iron 22, TIBC 168, saturation 13, ferritin 467, folate 17.7 and B12: 296.  Elevated total bili  - Hematology felt this to be related to mild hemolysis and sepsis. Improved and bilirubin trended down from 10-2.1. GI indicated no biliary issues to evaluate or treat and did not recommend any further GI interventions.  Splenomegaly  Per heme likely 2/2 to septic process and not a hematological disorder. Hematologist recommended follow-up imaging studies in the future may be needed to ensure resolution of her splenomegaly once the acute sepsis episode has completely resolved. GI did not see a need for bone marrow biopsy for further hematological workup at this time. Prior to discharge, discussed with patient regarding need for repeat imaging as outpatient and she can follow-up with her BCPs. She verbalized understanding. GI recommendations appreciated    Hypokalemia Replaced prior to discharge. Follow BMP as outpatient. Magnesium 1.8.  Diarrhea  C. difficile negative. Stool culture was also negative. Resolved.  Cholelithiasis - Seen on imaging studies.  Leukocytosis - Most likely secondary to acute infectious process discussed above. Improved from 34 to 14-15 range in the last several days. Follow-up outpatient with repeat CBCs.   Consultants:   GI   HEME  Procedures:   None   Discharge Instructions  Discharge Instructions    Activity as tolerated - No restrictions    Complete by:  As directed    Call MD for:  difficulty breathing, headache or visual disturbances    Complete by:  As directed    Call MD for:  extreme fatigue    Complete by:  As directed    Call MD for:   persistant dizziness or light-headedness    Complete by:  As directed    Call MD for:  persistant nausea and vomiting    Complete by:  As directed    Call MD for:  severe uncontrolled pain    Complete by:  As directed    Call MD for:  temperature >100.4    Complete by:  As directed    Diet general    Complete by:  As directed        Medication List    STOP taking these medications   metroNIDAZOLE 500 MG tablet Commonly known as:  FLAGYL     TAKE these medications   docusate sodium 100 MG capsule Commonly known as:  COLACE Take 1 capsule (100 mg total) by mouth 2 (two) times daily.   ferrous sulfate 325 (65 FE) MG tablet Take 1 tablet (325 mg total) by mouth 2 (two) times daily with a meal.   levofloxacin 750 MG tablet Commonly known as:  LEVAQUIN Take 1 tablet (750 mg total) by mouth daily. Start taking on:  04/28/2016   sodium chloride 0.65 % Soln nasal spray Commonly known as:  OCEAN Place 1 spray into both nostrils as needed for congestion.      Follow-up [facility] Follow up.   Why:  phone number # [phone] arrive on May 08, 2015 at 9:45 am. To be seen with repeat labs (CBC & CMP). Contact information: [place] [id number]         No Known Allergies  Procedures/Studies: Dg Chest 2 View  Result Date: 04/25/2016 CLINICAL DATA:  Shortness of breath, former smoking history EXAM: CHEST  2 VIEW COMPARISON:  Portable chest x-ray of 04/21/2016 FINDINGS: On the lateral view, there are slightly prominent markings posteriorly suspicious for lower lobe pneumonia possibly medially at the right lung base. Followup chest x-ray is recommended. No pleural effusion is seen. Mediastinal and hilar contours are unremarkable. The heart is within normal limits in size. No bony abnormality is seen. IMPRESSION: Suspect patchy possibly right lower lobe pneumonia. Recommend followup. Electronically Signed    By: Ivar Drape M.D.   On: 04/25/2016 09:08   Ct Abdomen Pelvis W Contrast  Result Date: 04/21/2016 CLINICAL DATA:  Vomiting for 2 days, body aches, fever, headache, former smoker EXAM: CT ABDOMEN AND PELVIS WITH CONTRAST TECHNIQUE: Multidetector CT imaging of the abdomen and pelvis was performed using the standard protocol following bolus administration of intravenous contrast. Sagittal and coronal MPR images reconstructed from axial data set. CONTRAST:  186m ISOVUE-300 IOPAMIDOL (ISOVUE-300) INJECTION 61% IV. Oral contrast was not administered. COMPARISON:  None FINDINGS: Lower chest: Lung bases clear Hepatobiliary: Calcified gallstones in gallbladder largest 1.5 cm diameter. Liver unremarkable. Pancreas: Normal appearance Spleen: Prominent spleen, 14.8 x 5.4 x 14.3 cm (volume = 600 cm^3) without focal lesion Adrenals/Urinary Tract: Patchy LEFT nephrogram most consistent with pyelonephritis. No urinary tract calcification or dilatation. No renal mass lesions. Ureters and bladder normal appearance. Stomach/Bowel: Appendix not definitely visualized. Suboptimal assessment of stomach and bowel loops due to lack of GI contrast and adequate distention, no gross abnormality seen Vascular/Lymphatic: Vascular structures unremarkable. Few normal sized LEFT para-aortic lymph nodes. Reproductive: Unremarkable Other: No free air free fluid.  No hernia. Musculoskeletal: Bones unremarkable. IMPRESSION: Patchy LEFT nephrogram consistent with pyelonephritis, recommend correlation with urinalysis. Cholelithiasis. Electronically Signed   By: MLavonia DanaM.D.   On: 04/21/2016 15:13   [place]al  Result Date: 04/23/2016 CLINICAL DATA:  Pyelonephritis EXAM: RENAL / URINARY TRACT ULTRASOUND COMPLETE COMPARISON:  04/21/2016 FINDINGS: Right Kidney: Length: 11.6 cm. Normal cortical thickness and echogenicity. No mass, hydronephrosis or shadowing calcification. Left Kidney: Length: 13.3 cm. Normal cortical thickness and  echogenicity. No mass, hydronephrosis or shadowing calcification. Bladder: Appears normal for degree of bladder distention. Incidentally noted cholelithiasis. IMPRESSION: No renal sonographic abnormalities identified. Cholelithiasis. Electronically Signed   By: MLavonia DanaM.D.   On: 04/23/2016 12:51   Dg Chest Portable 1 View  Result Date: 04/21/2016 CLINICAL DATA:  Code sepsis EXAM: PORTABLE CHEST 1 VIEW COMPARISON:  None. FINDINGS: Normal heart size. Lungs clear. No pneumothorax. No pleural effusion. IMPRESSION: No active disease. Electronically Signed   By: AMarybelle KillingsM.D.   On: 04/21/2016 13:07      Subjective: On day of discharge, patient denied any pain, dyspnea, cough, dizziness, lightheadedness, palpitations. She stated that her menstrual periods had finished. She complained of intermittent nasal stuffiness. She was eager to go home.  Discharge Exam:  Vitals:   04/26/16 2354 04/27/16 0444 04/27/16 0759 04/27/16 1214  BP: 129/80 113/86 128/85 133/90  Pulse:      Resp: _0 Temp: 98.9 F (37.2 C) 98.8 F (37.1 C) 98.2 F (36.8 C) 98.3 F (36.8 C)  TempSrc: Oral Oral Oral Oral  SpO2: 100% 99% 100% 99%  Weight:      Height:        General exam:  Pleasant young female lying comfortably supine in bed. Looks much improved compared to couple days ago. Does  not look septic or toxic. Respiratory system: Occasional basal crackles but otherwise clear to auscultation. No increased work of breathing. Cardiovascular system: S1S2 RRR no murmurs  . No JVD or pedal edema. Telemetry: Sinus Rhythm. Gastrointestinal system: Soft NT ND negative CVA tenderness  Central nervous system: Alert and oriented. No focal deficits. Extremities: No pedal edema.      The results of significant diagnostics from this hospitalization (including imaging, microbiology, ancillary and laboratory) are listed below for reference.     Microbiology: Recent Results (from the past 240 hour(s))   Blood Culture (routine x 2)     Status: None   Collection Time: 04/21/16 11:45 AM  Result Value Ref Range Status   Specimen Description RIGHT ANTECUBITAL  Final   Special Requests BOTTLES DRAWN AEROBIC AND ANAEROBIC [place]  Final   Culture NO GROWTH 6 DAYS  Final   Report Status 04/27/2016 FINAL  Final  Urine culture     Status: Abnormal   Collection Time: 04/21/16 11:47 AM  Result Value Ref Range Status   Specimen Description URINE, CLEAN CATCH  Final   Special Requests NONE  Final   Culture MULTIPLE SPECIES PRESENT, SUGGEST RECOLLECTION (A)  Final   Report Status 04/23/2016 FINAL  Final  Blood Culture (routine x 2)     Status: None   Collection Time: 04/21/16 12:39 PM  Result Value Ref Range Status   Specimen Description LEFT ANTECUBITAL  Final   Special Requests BOTTLES DRAWN AEROBIC AND ANAEROBIC 6CC EACH  Final   Culture NO GROWTH 6 DAYS  Final   Report Status 04/27/2016 FINAL  Final  MRSA PCR Screening     Status: None   Collection Time: 04/21/16  4:44 PM  Result Value Ref Range Status   MRSA by PCR NEGATIVE NEGATIVE Final    Comment:        The GeneXpert MRSA Assay (FDA approved for NASAL specimens only), is one component of a comprehensive MRSA colonization surveillance program. It is not intended to diagnose MRSA infection nor to guide or monitor treatment for MRSA infections.   MRSA PCR Screening     Status: None   Collection Time: 04/21/16  7:00 PM  Result Value Ref Range Status   MRSA by PCR NEGATIVE NEGATIVE Final    Comment:        The GeneXpert MRSA Assay (FDA approved for NASAL specimens only), is one component of a comprehensive MRSA colonization surveillance program. It is not intended to diagnose MRSA infection nor to guide or monitor treatment for MRSA infections.   C difficile quick scan w PCR reflex     Status: None   Collection Time: 04/23/16 10:47 AM  Result Value Ref Range Status   C Diff antigen NEGATIVE NEGATIVE Final   C Diff  toxin NEGATIVE NEGATIVE Final   C Diff interpretation No C. difficile detected.  Final  Stool culture (children & immunocomp patients)     Status: None (Preliminary result)   Collection Time: 04/23/16 10:48 AM  Result Value Ref Range Status   Salmonella/Shigella Screen Final report  Final    Comment: (NOTE) Performed At: [facility] [place], [place] [id number] Lindon Romp MD VV:[id number]    Campylobacter Culture PENDING  Incomplete   E coli, Shiga toxin Assay Negative Negative Final    Comment: (NOTE) Performed At: [facility] [address] [place], [place] [id number] Lindon Romp MD [id number]   STOOL CULTURE REFLEX - [place]  Status: None   Collection Time: 04/23/16 10:48 AM  Result Value Ref Range Status   Stool Culture result 1 (RSASHR) Comment  Final    Comment: (NOTE) No Salmonella or Shigella recovered. Performed At: [facility] [place], [place] [id number] Lindon Romp MD [id number]   Culture, blood (Routine X 2) w Reflex to ID Panel     Status: None (Preliminary result)   Collection Time: 04/23/16 12:01 PM  Result Value Ref Range Status   Specimen Description BLOOD RIGHT ARM  Final   Special Requests IN PEDIATRIC BOTTLE 2CC`  Final   Culture NO GROWTH 3 DAYS  Final   Report Status PENDING  Incomplete  Culture, blood (Routine X 2) w Reflex to ID Panel     Status: None (Preliminary result)   Collection Time: 04/23/16 12:01 PM  Result Value Ref Range Status   Specimen Description BLOOD RIGHT HAND  Final   Special Requests IN PEDIATRIC BOTTLE 2CC  Final   Culture NO GROWTH 3 DAYS  Final   Report Status PENDING  Incomplete     Labs: BNP (last 3 results) No results for input(s): BNP in the last 8760 hours. Basic Metabolic Panel:  Recent Labs Lab 04/21/16 1907  04/23/16 0236 04/24/16 0831 04/25/16 0202 04/26/16 0556 04/26/16 1055 04/27/16 0819  NA  --   < > 137 135 134* 135  --   139  K  --   < > 3.2* 3.8 3.6 3.1*  --  3.3*  CL  --   < > 109 109 107 105  --  108  CO2  --   < > 24 20* 20* 24  --  21*  GLUCOSE  --   < > 116* 121* 86 98  --  97  BUN  --   < > 5* <5* 5* <5*  --  <5*  CREATININE 0.76  < > 0.60 0.62 0.58 0.58  --  0.54  CALCIUM  --   < > 8.0* 7.9* 8.1* 8.2*  --  8.6*  MG 1.6*  --   --   --   --   --  1.8  --   < > = values in this interval not displayed. Liver Function Tests:  Recent Labs Lab 04/22/16 0510 04/23/16 0236 04/24/16 0831 04/25/16 0159 04/26/16 0556  AST 23 22 43* 43* 24  ALT _0 34 27  ALKPHOS 41 40 49 52 49  BILITOT 5.5* 3.2* 5.0* 3.7* 2.1*  PROT 6.3* 5.9* 6.1* 6.3* 6.3*  ALBUMIN 2.9* 2.6* 2.5* 2.5* 2.5*    Recent Labs Lab 04/21/16 1147 04/23/16 0236  LIPASE 19 17  AMYLASE  --  34   CBC:  Recent Labs Lab 04/21/16 1147 04/21/16 1216  04/23/16 0236 04/24/16 0831 04/25/16 0202 04/26/16 0556 04/27/16 9323  WBC 55.7* DUPLICATE  < > 32.2 02.5* 14.4* 14.9* 15.6*  NEUTROABS 29.9* PENDING  --  8.4*  --  10.3* 9.7*  --   HGB 42.7* DUPLICATE  < > 7.6* 7.6* 7.6* 7.8* 8.8*  HCT 06.2* DUPLICATE  < > 37.6* 28.3* 21.9* 23.0* 15.1*  MCV 76.1 DUPLICATE  < > 60.7 37.1 79.3 82.1 06.2  PLT 694 DUPLICATE  < > 854 627 035 315 342  < > = values in this interval not displayed.  Urinalysis    Component Value Date/Time   COLORURINE AMBER (A) 04/21/2016 1147   APPEARANCEUR HAZY (A) 04/21/2016 1147   LABSPEC 1.017 04/21/2016  1147   PHURINE 5.0 04/21/2016 1147   [place] 04/21/2016 1147   HGBUR MODERATE (A) 04/21/2016 1147   BILIRUBINUR SMALL (A) 04/21/2016 1147   KETONESUR 20 (A) 04/21/2016 1147   PROTEINUR 100 (A) 04/21/2016 1147   NITRITE NEGATIVE 04/21/2016 1147   LEUKOCYTESUR SMALL (A) 04/21/2016 1147      Time coordinating discharge: Over 30 minutes  SIGNED:  Vernell Leep, MD, FACP, FHM. [facility] Pager [phone]  If 7PM-7AM, please contact night-coverage [email or web address] Password  [facility] 04/27/2016, 2:43 PM

## 2016-04-27 NOTE — Care Management Note (Signed)
Case Management Note  Patient Details  Name: Sara Roth MRN: [id number] Date of Birth: [birth year]  Subjective/Objective:                    Action/Plan: Pt discharging home with self care. Pt without insurance. CM provided her with a MATCH letter to assist her with the cost of her medications at discharge. CM also provided her with a list of pharmacies that [facility]. Pt states she has transportation home. Pt already set up with PCP post discharge.   Expected Discharge Date:  04/23/16               Expected Discharge Plan:  Home/Self Care  In-House Referral:  Financial Counselor  Discharge planning Services  CM Consult, MATCH Program, Medication Assistance  Post Acute Care Choice:  NA Choice offered to:  NA  DME Arranged:  N/A DME Agency:  NA  HH Arranged:  NA HH Agency:  NA  Status of Service:  Completed, signed off  If discussed at Long Length of Stay Meetings, dates discussed:    Additional Comments:  Kermit BaloKelli F Juliana Boling, RN 04/27/2016, 2:46 PM

## 2016-04-27 NOTE — Progress Notes (Signed)
Pt given discharge instructions with understanding. Pt given prescriptions and has a list of where she is able to get her meds filled. Informed pt about the importance to get medicines filled and take them as prescribed.  Pt also given note for work during hospitalization. Pt IVs taken out and monitor removed. Pts friend at bedside.

## 2016-04-27 NOTE — Discharge Instructions (Signed)
Pyelonephritis, Adult °Introduction °Pyelonephritis is a kidney infection. The kidneys are the organs that filter a person's blood and move waste out of the bloodstream and into the urine. Urine passes from the kidneys, through the ureters, and into the bladder. There are two main types of pyelonephritis: °· Infections that come on quickly without any warning (acute pyelonephritis). °· Infections that last for a long period of time (chronic pyelonephritis). °In most cases, the infection clears up with treatment and does not cause further problems. More severe infections or chronic infections can sometimes spread to the bloodstream or lead to other problems with the kidneys. °What are the causes? °This condition is usually caused by: °· Bacteria traveling from the bladder to the kidney through infected urine. The urine in the bladder can become infected with bacteria from: °¨ Bladder infection (cystitis). °¨ Inflammation of the prostate gland (prostatitis). °¨ Sexual intercourse, in females. °· Bacteria traveling from the bloodstream to the kidney. °What increases the risk? °This condition is more likely to develop in: °· Pregnant women. °· Older people. °· People who have diabetes. °· People who have kidney stones or bladder stones. °· People who have other abnormalities of the kidney or ureter. °· People who have a catheter placed in the bladder. °· People who have cancer. °· People who are sexually active. °· Women who use spermicides. °· People who have had a prior urinary tract infection. °What are the signs or symptoms? °Symptoms of this condition include: °· Frequent urination. °· Strong or persistent urge to urinate. °· Burning or stinging when urinating. °· Abdominal pain. °· Back pain. °· Pain in the side or flank area. °· Fever. °· Chills. °· Blood in the urine, or dark urine. °· Nausea. °· Vomiting. °How is this diagnosed? °This condition may be diagnosed based on: °· Medical history and physical  exam. °· Urine tests. °· Blood tests. °You may also have imaging tests of the kidneys, such as an ultrasound or CT scan. °How is this treated? °Treatment for this condition may depend on the severity of the infection. °· If the infection is mild and is found early, you may be treated with antibiotic medicines taken by mouth. You will need to drink fluids to remain hydrated. °· If the infection is more severe, you may need to stay in the hospital and receive antibiotics given directly into a vein through an IV tube. You may also need to receive fluids through an IV tube if you are not able to remain hydrated. After your hospital stay, you may need to take oral antibiotics for a period of time. °Other treatments may be required, depending on the cause of the infection. °Follow these instructions at home: °Medicines °· Take over-the-counter and prescription medicines only as told by your health care provider. °· If you were prescribed an antibiotic medicine, take it as told by your health care provider. Do not stop taking the antibiotic even if you start to feel better. °General instructions °· Drink enough fluid to keep your urine clear or pale yellow. °· Avoid caffeine, tea, and carbonated beverages. They tend to irritate the bladder. °· Urinate often. Avoid holding in urine for long periods of time. °· Urinate before and after sex. °· After a bowel movement, women should cleanse from front to back. Use each tissue only once. °· Keep all follow-up visits as told by your health care provider. This is important. °Contact a health care provider if: °· Your symptoms do not get better after   2 days of treatment.  Your symptoms get worse.  You have a fever. Get help right away if:  You are unable to take your antibiotics or fluids.  You have shaking chills.  You vomit.  You have severe flank or back pain.  You have extreme weakness or fainting. This information is not intended to replace advice given to you  by your health care provider. Make sure you discuss any questions you have with your health care provider. Document Released: 04/16/2005 Document Revised: 09/22/2015 Document Reviewed: 08/09/2014  2017 Elsevier   Community-Acquired Pneumonia, Adult Pneumonia is an infection of the lungs. There are different types of pneumonia. One type can develop while a person is in a hospital. A different type, called community-acquired pneumonia, develops in people who are not, or have not recently been, in the hospital or other health care facility. What are the causes? Pneumonia may be caused by bacteria, viruses, or funguses. Community-acquired pneumonia is often caused by Streptococcus pneumonia bacteria. These bacteria are often passed from one person to another by breathing in droplets from the cough or sneeze of an infected person. What increases the risk? The condition is more likely to develop in:  People who havechronic diseases, such as chronic obstructive pulmonary disease (COPD), asthma, congestive heart failure, cystic fibrosis, diabetes, or kidney disease.  People who haveearly-stage or late-stage HIV.  People who havesickle cell disease.  People who havehad their spleen removed (splenectomy).  People who havepoor Administrator.  People who havemedical conditions that increase the risk of breathing in (aspirating) secretions their own mouth and nose.  People who havea weakened immune system (immunocompromised).  People who smoke.  People whotravel to areas where pneumonia-causing germs commonly exist.  People whoare around animal habitats or animals that have pneumonia-causing germs, including birds, bats, rabbits, cats, and farm animals. What are the signs or symptoms? Symptoms of this condition include:  Adry cough.  A wet (productive) cough.  Fever.  Sweating.  Chest pain, especially when breathing deeply or coughing.  Rapid breathing or difficulty  breathing.  Shortness of breath.  Shaking chills.  Fatigue.  Muscle aches. How is this diagnosed? Your health care provider will take a medical history and perform a physical exam. You may also have other tests, including:  Imaging studies of your chest, including X-rays.  Tests to check your blood oxygen level and other blood gases.  Other tests on blood, mucus (sputum), fluid around your lungs (pleural fluid), and urine. If your pneumonia is severe, other tests may be done to identify the specific cause of your illness. How is this treated? The type of treatment that you receive depends on many factors, such as the cause of your pneumonia, the medicines you take, and other medical conditions that you have. For most adults, treatment and recovery from pneumonia may occur at home. In some cases, treatment must happen in a hospital. Treatment may include:  Antibiotic medicines, if the pneumonia was caused by bacteria.  Antiviral medicines, if the pneumonia was caused by a virus.  Medicines that are given by mouth or through an IV tube.  Oxygen.  Respiratory therapy. Although rare, treating severe pneumonia may include:  Mechanical ventilation. This is done if you are not breathing well on your own and you cannot maintain a safe blood oxygen level.  Thoracentesis. This procedureremoves fluid around one lung or both lungs to help you breathe better. Follow these instructions at home:  Take over-the-counter and prescription medicines only as told  by your health care provider.  Only takecough medicine if you are losing sleep. Understand that cough medicine can prevent your bodys natural ability to remove mucus from your lungs.  If you were prescribed an antibiotic medicine, take it as told by your health care provider. Do not stop taking the antibiotic even if you start to feel better.  Sleep in a semi-upright position at night. Try sleeping in a reclining chair, or place a  few pillows under your head.  Do not use tobacco products, including cigarettes, chewing tobacco, and e-cigarettes. If you need help quitting, ask your health care provider.  Drink enough water to keep your urine clear or pale yellow. This will help to thin out mucus secretions in your lungs. How is this prevented? There are ways that you can decrease your risk of developing community-acquired pneumonia. Consider getting a pneumococcal vaccine if:  You are older than 20 years of age.  You are older than 20 years of age and are undergoing cancer treatment, have chronic lung disease, or have other medical conditions that affect your immune system. Ask your health care provider if this applies to you. There are different types and schedules of pneumococcal vaccines. Ask your health care provider which vaccination option is best for you. You may also prevent community-acquired pneumonia if you take these actions:  Get an influenza vaccine every year. Ask your health care provider which type of influenza vaccine is best for you.  Go to the dentist on a regular basis.  Wash your hands often. Use hand sanitizer if soap and water are not available. Contact a health care provider if:  You have a fever.  You are losing sleep because you cannot control your cough with cough medicine. Get help right away if:  You have worsening shortness of breath.  You have increased chest pain.  Your sickness becomes worse, especially if you are an older adult or have a weakened immune system.  You cough up blood. This information is not intended to replace advice given to you by your health care provider. Make sure you discuss any questions you have with your health care provider. Document Released: 04/16/2005 Document Revised: 08/25/2015 Document Reviewed: 08/11/2014 Else[employer] Interactive Patient Education  2017 [employer]  Anemia, Nonspecific Anemia is a condition in which the concentration of red  blood cells or hemoglobin in the blood is below normal. Hemoglobin is a substance in red blood cells that carries oxygen to the tissues of the body. Anemia results in not enough oxygen reaching these tissues. What are the causes? Common causes of anemia include:  Excessive bleeding. Bleeding may be internal or external. This includes excessive bleeding from periods (in women) or from the intestine.  Poor nutrition.  Chronic kidney, thyroid, and liver disease.  Bone marrow disorders that decrease red blood cell production.  Cancer and treatments for cancer.  HIV, AIDS, and their treatments.  Spleen problems that increase red blood cell destruction.  Blood disorders.  Excess destruction of red blood cells due to infection, medicines, and autoimmune disorders. What are the signs or symptoms?  Minor weakness.  Dizziness.  Headache.  Palpitations.  Shortness of breath, especially with exercise.  Paleness.  Cold sensitivity.  Indigestion.  Nausea.  Difficulty sleeping.  Difficulty concentrating. Symptoms may occur suddenly or they may develop slowly. How is this diagnosed? Additional blood tests are often needed. These help your health care provider determine the best treatment. Your health care provider will check your stool for  blood and look for other causes of blood loss. How is this treated? Treatment varies depending on the cause of the anemia. Treatment can include:  Supplements of iron, vitamin B12, or folic acid.  Hormone medicines.  A blood transfusion. This may be needed if blood loss is severe.  Hospitalization. This may be needed if there is significant continual blood loss.  Dietary changes.  Spleen removal. Follow these instructions at home: Keep all follow-up appointments. It often takes many weeks to correct anemia, and having your health care provider check on your condition and your response to treatment is very important. Get help right  away if:  You develop extreme weakness, shortness of breath, or chest pain.  You become dizzy or have trouble concentrating.  You develop heavy vaginal bleeding.  You develop a rash.  You have bloody or black, tarry stools.  You faint.  You vomit up blood.  You vomit repeatedly.  You have abdominal pain.  You have a fever or persistent symptoms for more than 2-3 days.  You have a fever and your symptoms suddenly get worse.  You are dehydrated. This information is not intended to replace advice given to you by your health care provider. Make sure you discuss any questions you have with your health care provider. Document Released: 05/24/2004 Document Revised: 09/28/2015 Document Reviewed: 10/10/2012 Else[employer] Interactive Patient Education  2017 [employer]   Other discharge instructions:    Please get your medications reviewed and adjusted by your Primary MD.  Please request your Primary MD to go over all Hospital Tests and Procedure/Radiological results at the follow up, please get all Hospital records sent to your Prim MD by signing hospital release before you go home.  If you had Pneumonia of Lung problems at the Hospital: Please get a 2 view Chest X ray done in 3-4 weeks after hospital discharge or sooner if instructed by your Primary MD.  If you have Congestive Heart Failure: Please call your Cardiologist or Primary MD anytime you have any of the following symptoms:  1) 3 pound weight gain in 24 hours or 5 pounds in 1 week  2) shortness of breath, with or without a dry hacking cough  3) swelling in the hands, feet or stomach  4) if you have to sleep on extra pillows at night in order to breathe  Follow cardiac low salt diet and 1.5 lit/day fluid restriction.  If you have diabetes Accuchecks 4 times/day, Once in AM empty stomach and then before each meal. Log in all results and show them to your primary doctor at your next visit. If any glucose reading is  under 80 or above 300 call your primary MD immediately.  If you have Seizure/Convulsions/Epilepsy: Please do not drive, operate heavy machinery, participate in activities at heights or participate in high speed sports until you have seen by Primary MD or a Neurologist and advised to do so again.  If you had Gastrointestinal Bleeding: Please ask your Primary MD to check a complete blood count within one week of discharge or at your next visit. Your endoscopic/colonoscopic biopsies that are pending at the time of discharge, will also need to followed by your Primary MD.  Get Medicines reviewed and adjusted. Please take all your medications with you for your next visit with your Primary MD  Please request your Primary MD to go over all hospital tests and procedure/radiological results at the follow up, please ask your Primary MD to get all Hospital records sent  to his/her office.  If you experience worsening of your admission symptoms, develop shortness of breath, life threatening emergency, suicidal or homicidal thoughts you must seek medical attention immediately by calling 911 or calling your MD immediately  if symptoms less severe.  You must read complete instructions/literature along with all the possible adverse reactions/side effects for all the Medicines you take and that have been prescribed to you. Take any new Medicines after you have completely understood and accpet all the possible adverse reactions/side effects.   Do not drive or operate heavy machinery when taking Pain medications.   Do not take more than prescribed Pain, Sleep and Anxiety Medications  Special Instructions: If you have smoked or chewed Tobacco  in the last 2 yrs please stop smoking, stop any regular Alcohol  and or any Recreational drug use.  Wear Seat belts while driving.  Please note You were cared for by a hospitalist during your hospital stay. If you have any questions about your discharge medications or the  care you received while you were in the hospital after you are discharged, you can call the unit and asked to speak with the hospitalist on call if the hospitalist that took care of you is not available. Once you are discharged, your primary care physician will handle any further medical issues. Please note that NO REFILLS for any discharge medications will be authorized once you are discharged, as it is imperative that you return to your primary care physician (or establish a relationship with a primary care physician if you do not have one) for your aftercare needs so that they can reassess your need for medications and monitor your lab values.  You can reach the hospitalist office at phone [phone] or fax [phone]   If you do not have a primary care physician, you can call [phone] for a physician referral.

## 2016-04-27 NOTE — Progress Notes (Signed)
Patient refused to have her morning labs drawn despite education. Labs rescheduled for 8 AM.

## 2016-04-28 LAB — CULTURE, BLOOD (ROUTINE X 2)
CULTURE: NO GROWTH
Culture: NO GROWTH

## 2016-04-28 LAB — STOOL CULTURE REFLEX - CMPCXR

## 2016-04-28 LAB — STOOL CULTURE: E COLI SHIGA TOXIN ASSAY: NEGATIVE

## 2016-04-28 LAB — STOOL CULTURE REFLEX - RSASHR

## 2017-03-12 IMAGING — US US RENAL
1 series · 14 of 25 positions shown · non-contrast
Comparison: 04/21/2016

CLINICAL DATA: Pyelonephritis

EXAM:
RENAL / URINARY TRACT ULTRASOUND COMPLETE

[Series 1: us renal · 0.25mm/px · 14 of 39 slices shown]
[im 1/39]
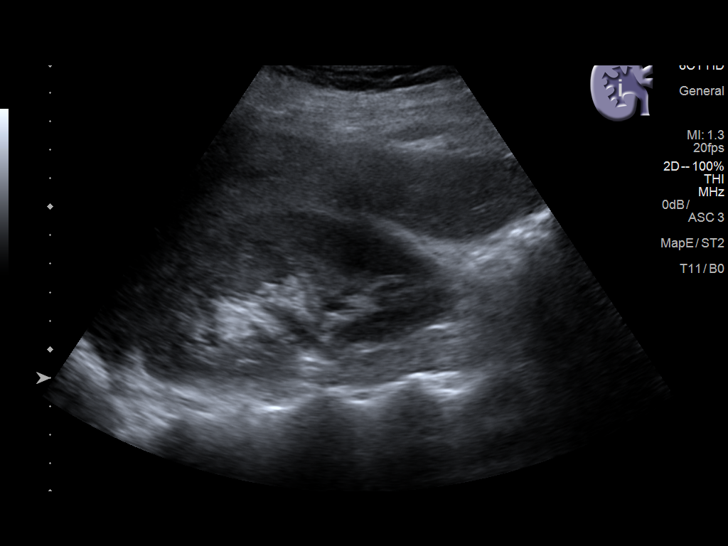
[im 4/39]
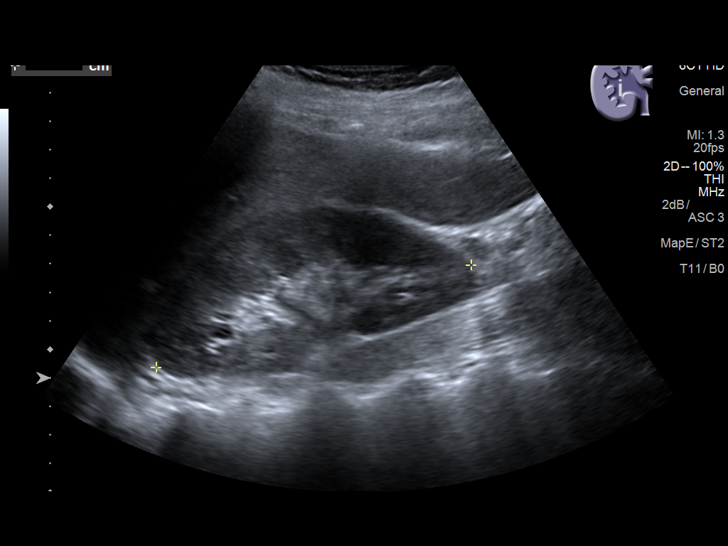
[im 7/39]
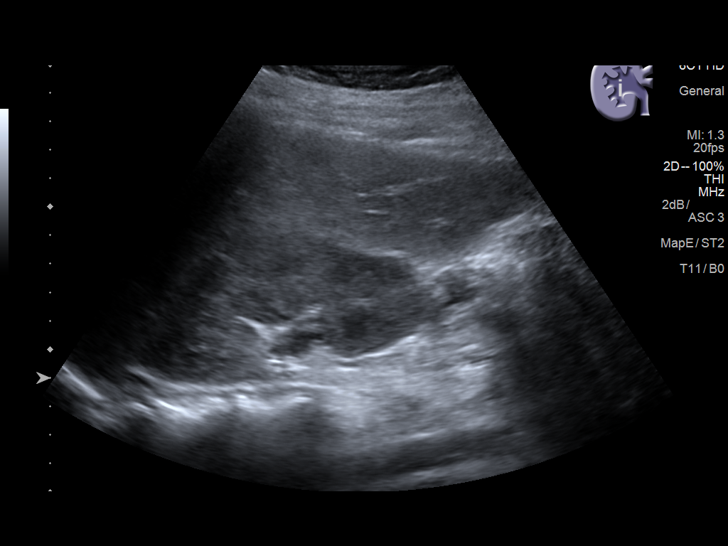
[im 10/39]
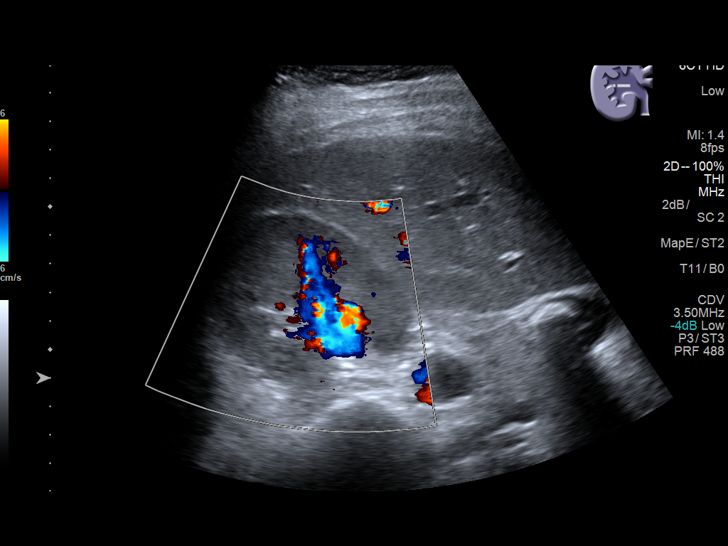
[im 13/39]
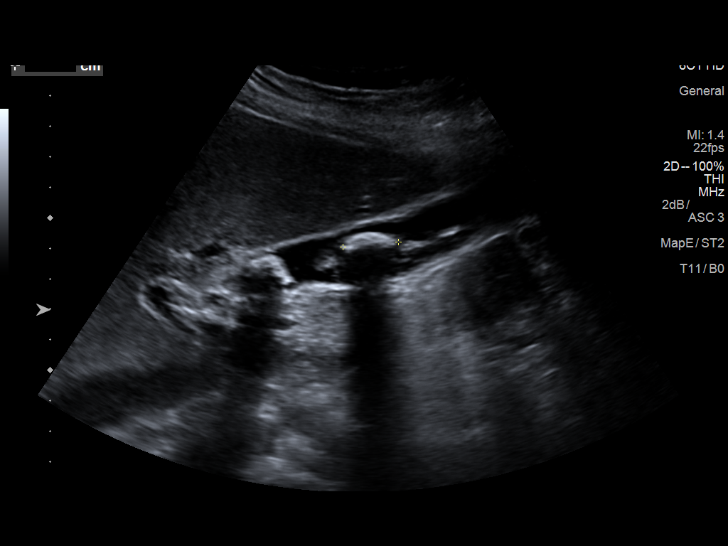
[im 15/39]
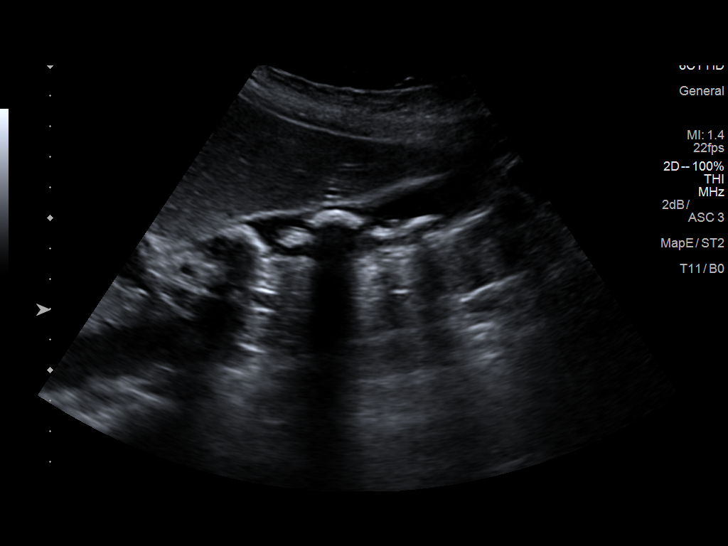
[im 18/39]
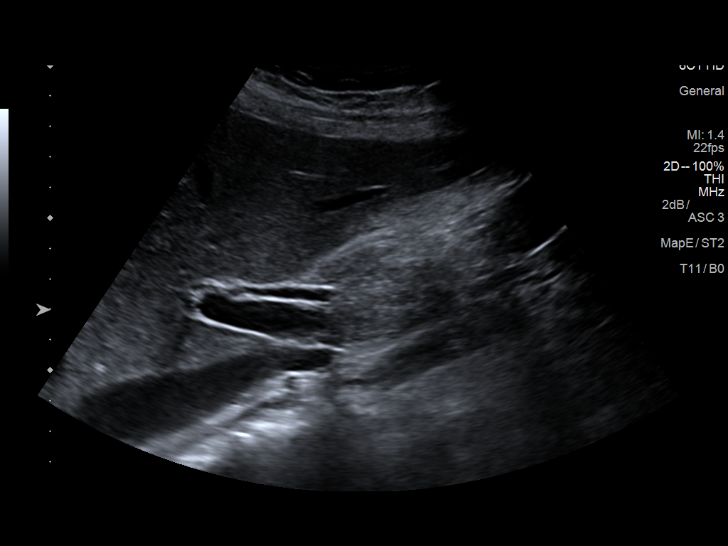
[im 21/39]
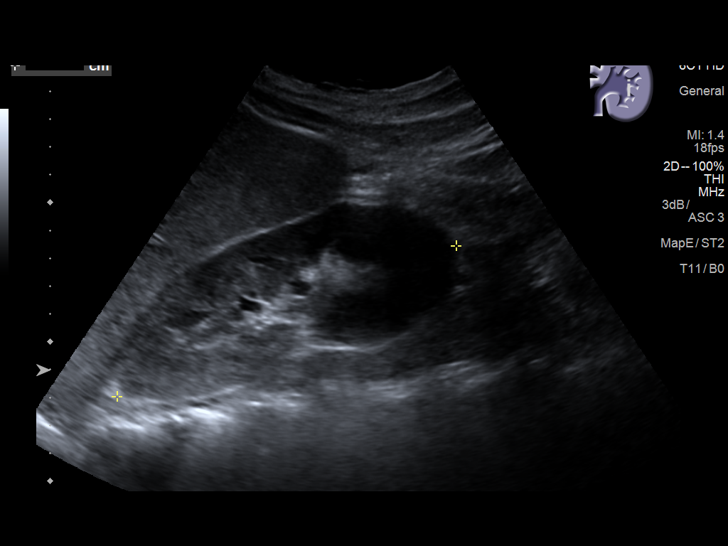
[im 24/39]
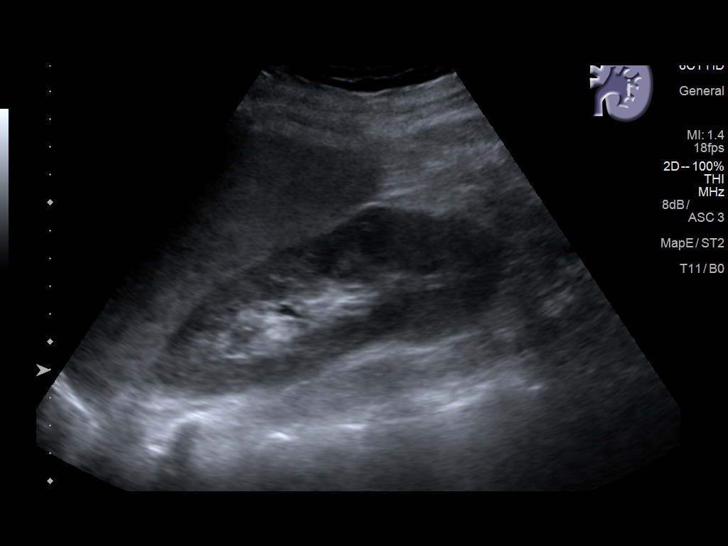
[im 26/39]
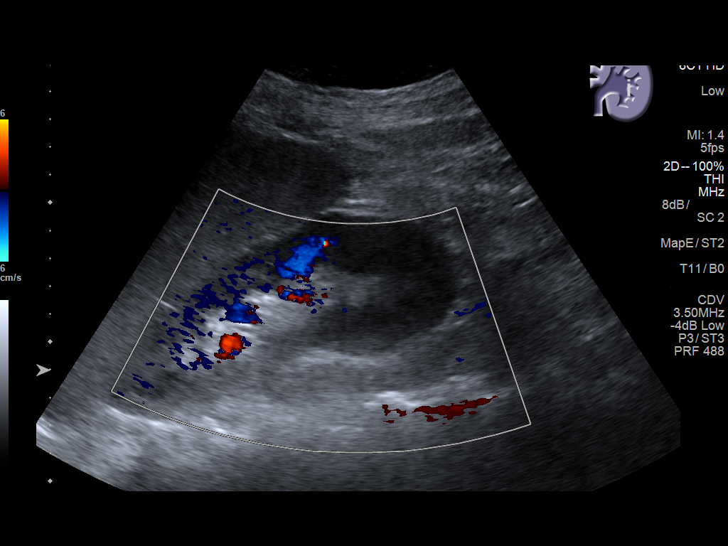
[im 29/39]
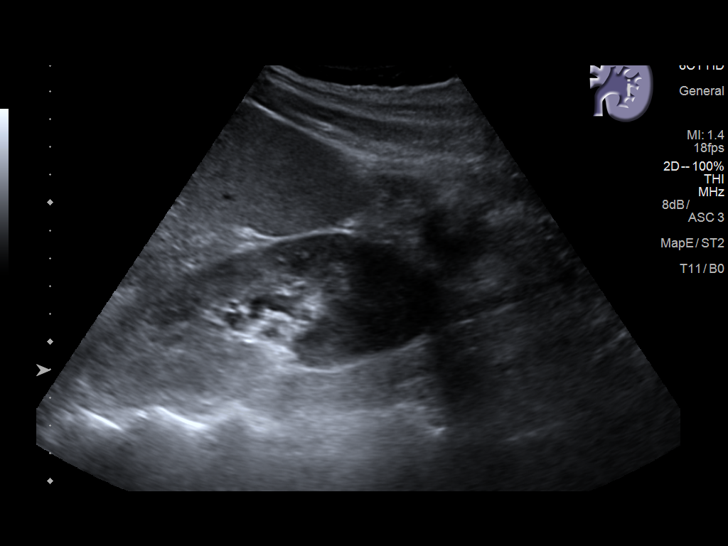
[im 32/39]
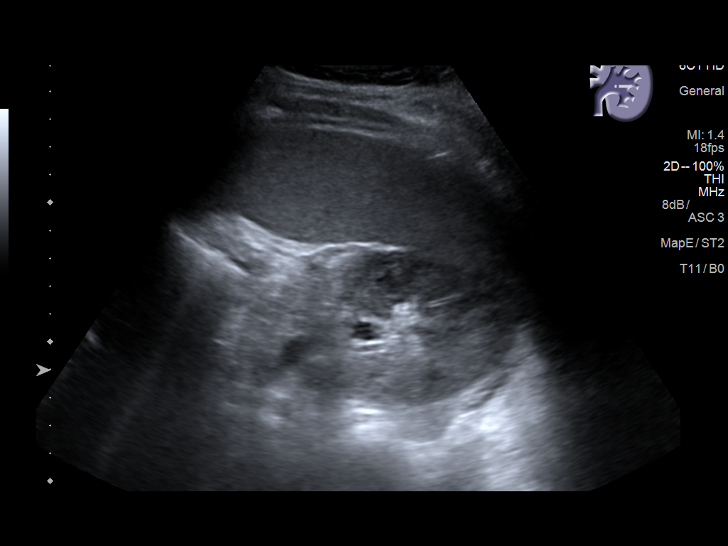
[im 35/39]
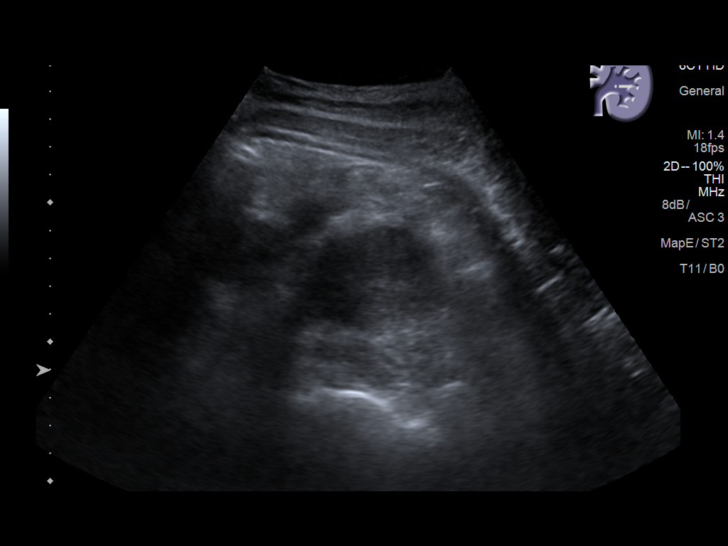
[im 39/39]
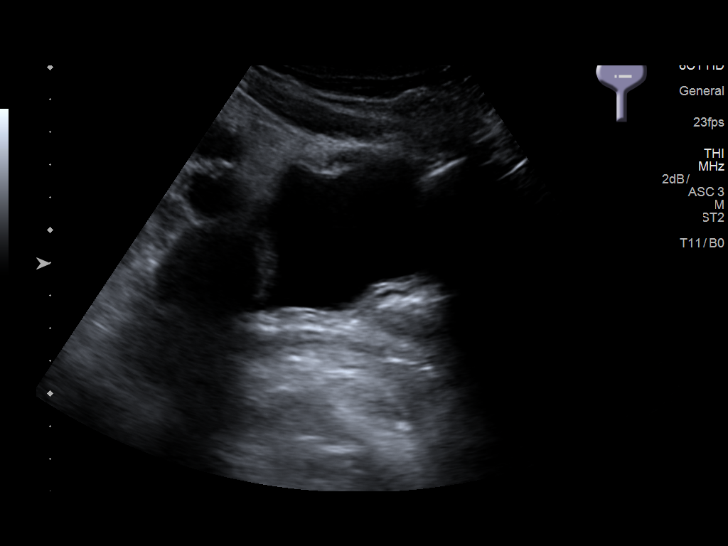

[14 of 25 positions shown; findings below may reference images not displayed]

FINDINGS: Right Kidney:

Length: 11.6 cm. Normal cortical thickness and echogenicity. No
mass, hydronephrosis or shadowing calcification.

Left Kidney:

Length: 13.3 cm. Normal cortical thickness and echogenicity. No
mass, hydronephrosis or shadowing calcification.

Bladder:

Appears normal for degree of bladder distention.

Incidentally noted cholelithiasis.
IMPRESSION: No renal sonographic abnormalities identified.

Cholelithiasis.

## 2018-06-05 IMAGING — CT CT ABD-PELV W/ CM
2 of 4 series · 16 of 46 positions shown, 18 images · IV contrast (Isovue)
Comparison: None

CLINICAL DATA: Vomiting for 2 days, body aches, fever, headache,
former smoker

EXAM:
CT ABDOMEN AND PELVIS WITH CONTRAST
TECHNIQUE: Multidetector CT imaging of the abdomen and pelvis was performed
using the standard protocol following bolus administration of
intravenous contrast. Sagittal and coronal MPR images reconstructed
from axial data set.
CONTRAST:  100mL HTJNA8-KJJ IOPAMIDOL (HTJNA8-KJJ) INJECTION 61% IV.
Oral contrast was not administered.

[Series 2: axial st · axial · 0.68mm/px · z∈[+621,+1001]mm · 13 of 87 slices shown, 15 images]
[im 7/87  soft-tissue]
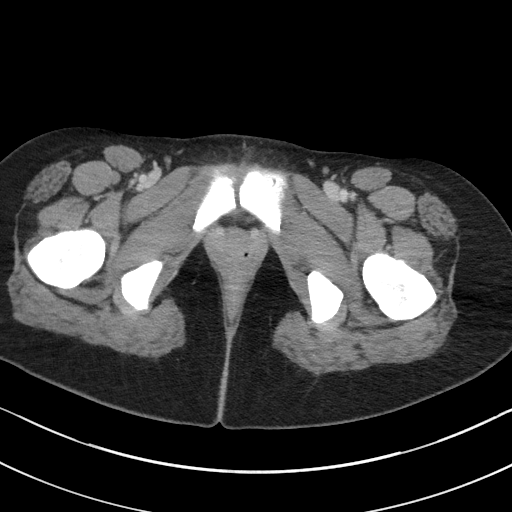
[im 7/87  bone]
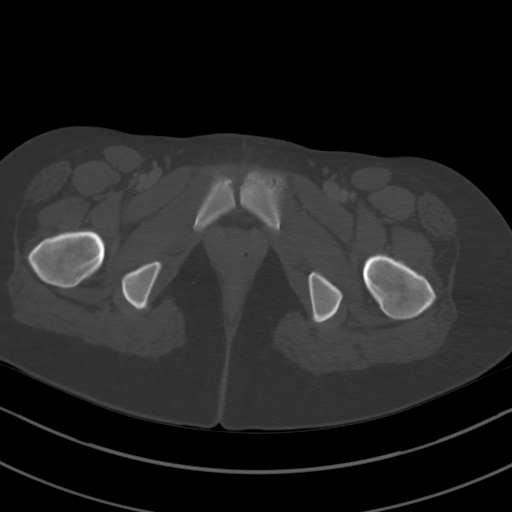
[im 13/87  soft-tissue]
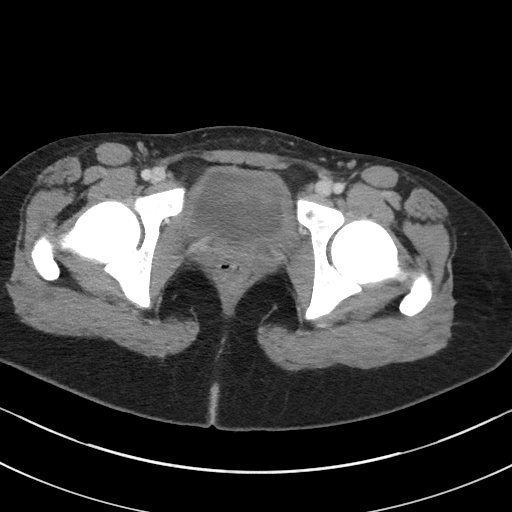
[im 20/87  soft-tissue]
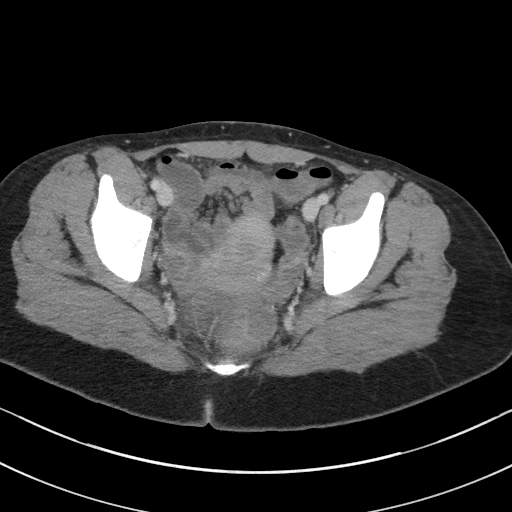
[im 26/87  soft-tissue]
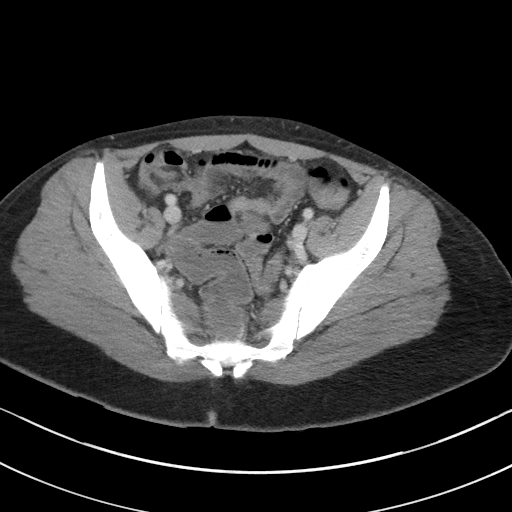
[im 32/87  soft-tissue]
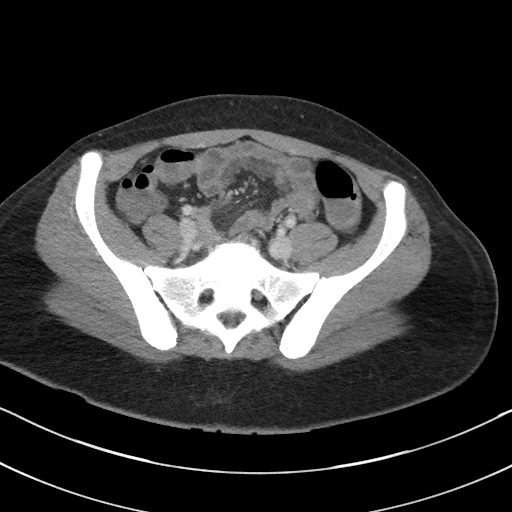
[im 39/87  soft-tissue]
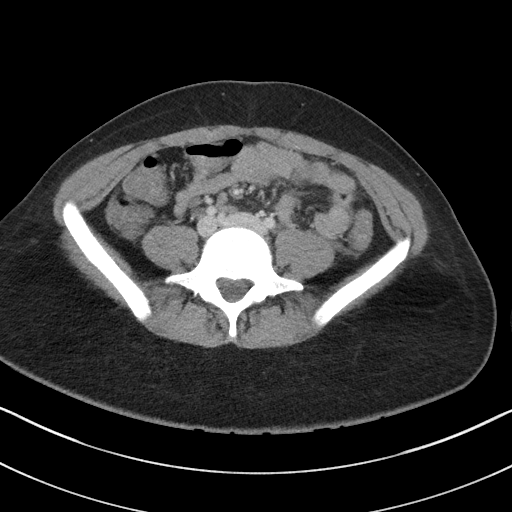
[im 45/87  soft-tissue]
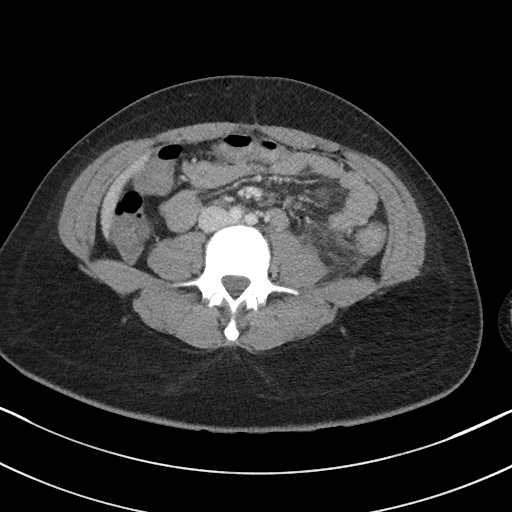
[im 51/87  soft-tissue]
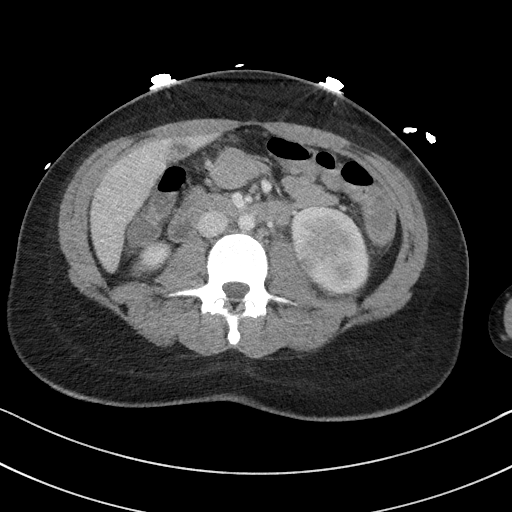
[im 58/87  soft-tissue]
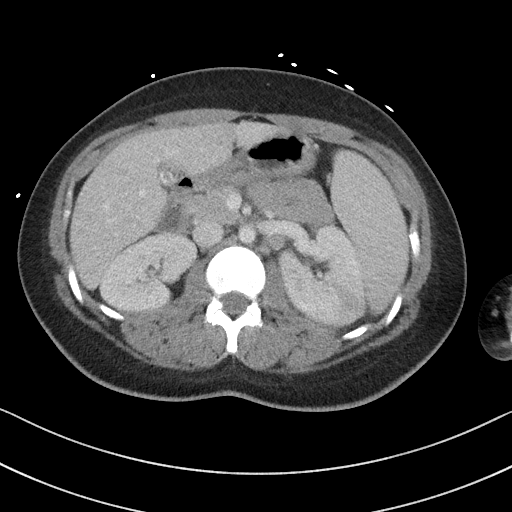
[im 58/87  bone]
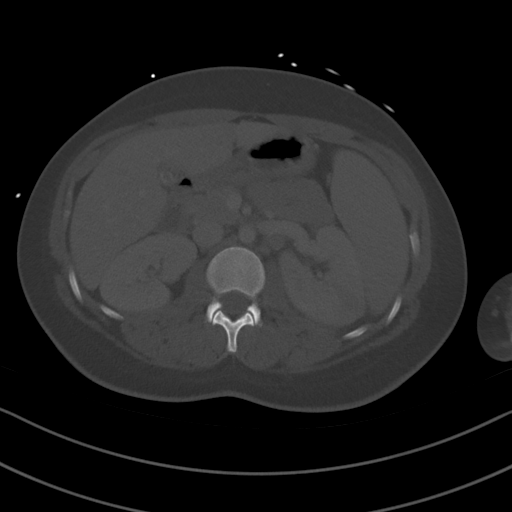
[im 64/87  soft-tissue]
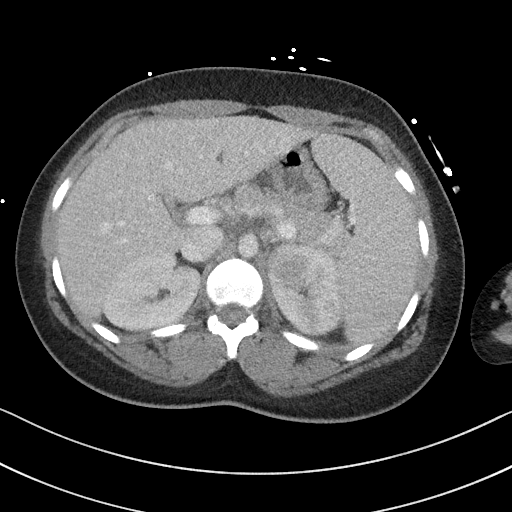
[im 71/87  soft-tissue]
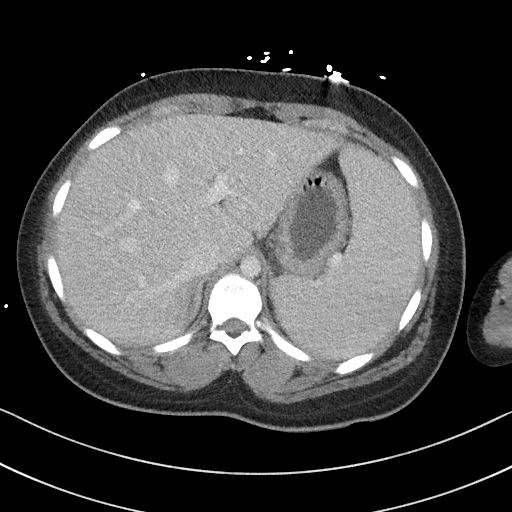
[im 77/87  soft-tissue]
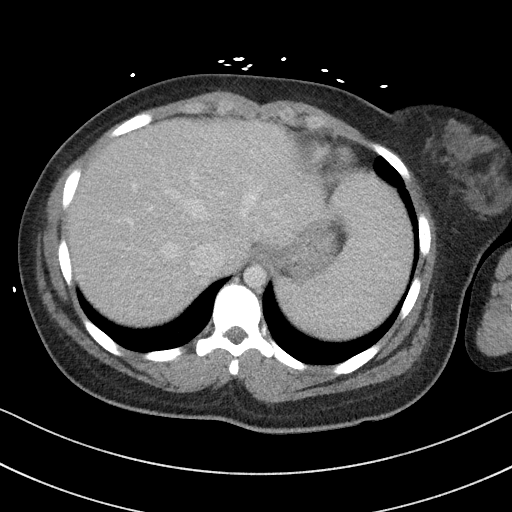
[im 83/87  soft-tissue]
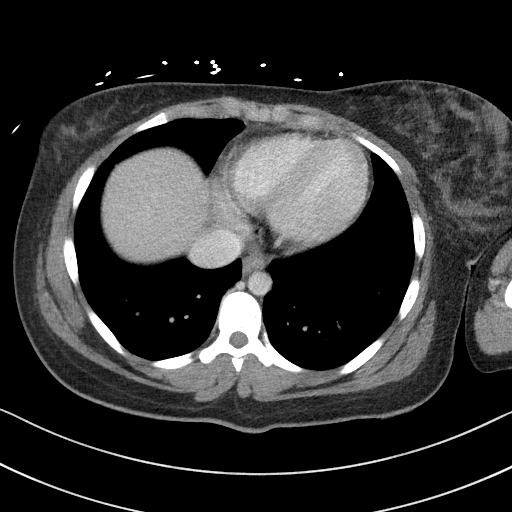

[Series 4: coronal st · coronal · 0.75mm/px · 3 of 88 slices shown]
[im 30/88  soft-tissue]
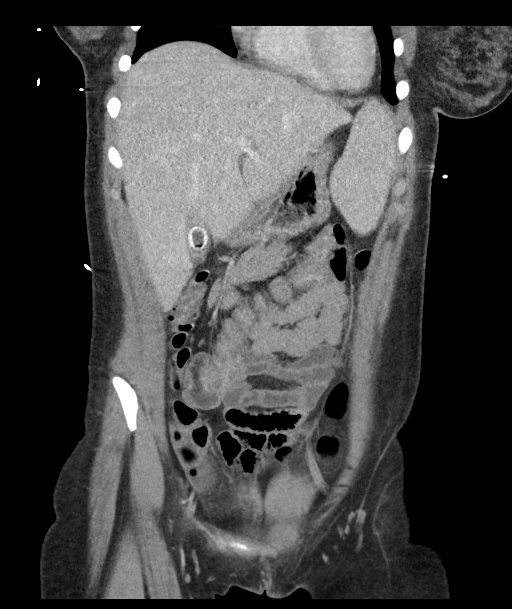
[im 39/88  soft-tissue]
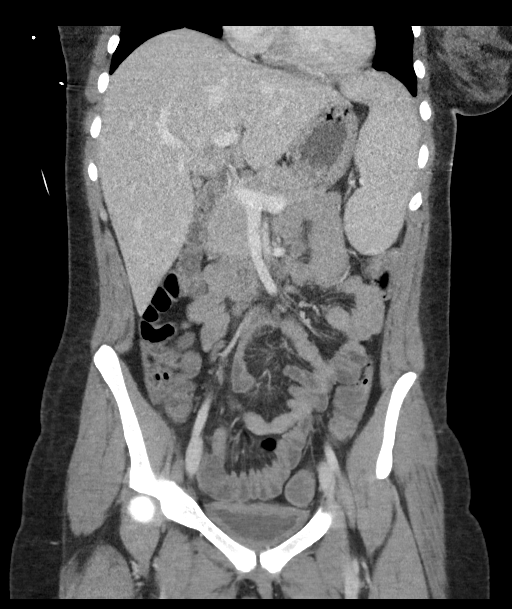
[im 49/88  soft-tissue]
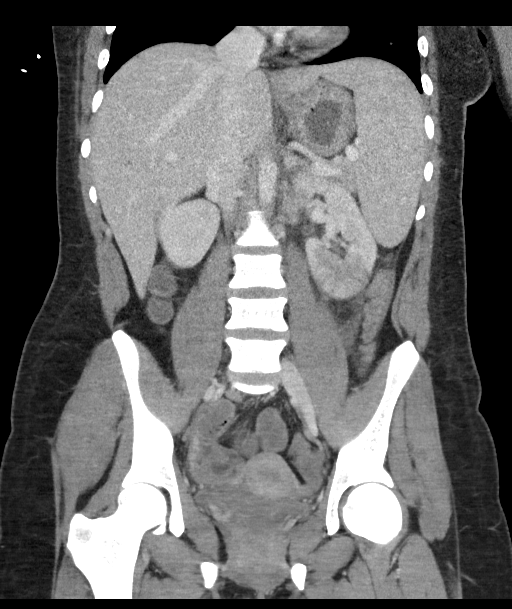

[16 of 46 positions shown; findings below may reference images not displayed]

FINDINGS: Lower chest: Lung bases clear

Hepatobiliary: Calcified gallstones in gallbladder largest 1.5 cm
diameter. Liver unremarkable.

Pancreas: Normal appearance

Spleen: Prominent spleen, 14.8 x 5.4 x 14.3 cm (volume = 600 cm^3)
without focal lesion

Adrenals/Urinary Tract: Patchy LEFT nephrogram most consistent with
pyelonephritis. No urinary tract calcification or dilatation. No
renal mass lesions. Ureters and bladder normal appearance.

Stomach/Bowel: Appendix not definitely visualized. Suboptimal
assessment of stomach and bowel loops due to lack of GI contrast and
adequate distention, no gross abnormality seen

Vascular/Lymphatic: Vascular structures unremarkable. Few normal
sized LEFT para-aortic lymph nodes.

Reproductive: Unremarkable

Other: No free air free fluid.  No hernia.

Musculoskeletal: Bones unremarkable.
IMPRESSION: Patchy LEFT nephrogram consistent with pyelonephritis, recommend
correlation with urinalysis.

Cholelithiasis.

## 2018-06-09 IMAGING — CR DG CHEST 2V
2 series · 2 of 2 positions shown · non-contrast
Comparison: Portable chest x-ray of 04/21/2016

CLINICAL DATA: Shortness of breath, former smoking history

EXAM:
CHEST  2 VIEW

[chest pa]
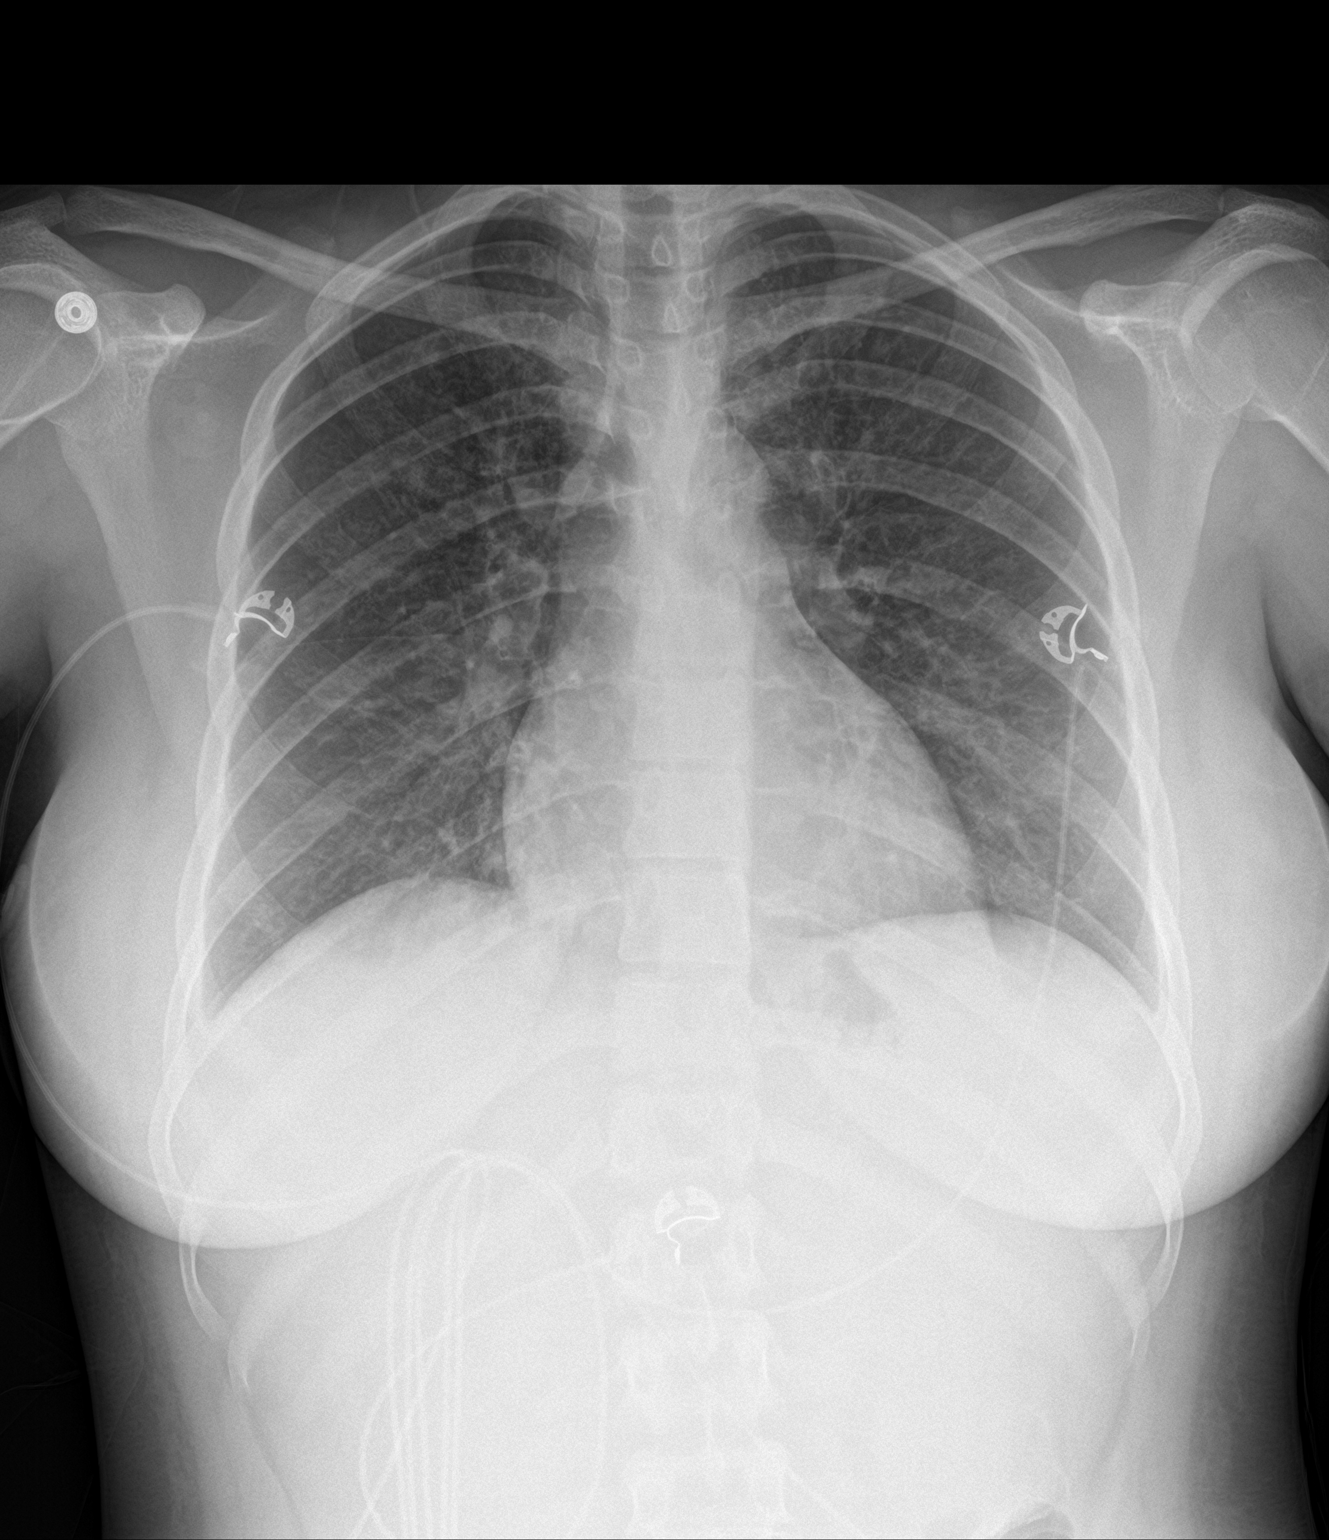

[chest lat]
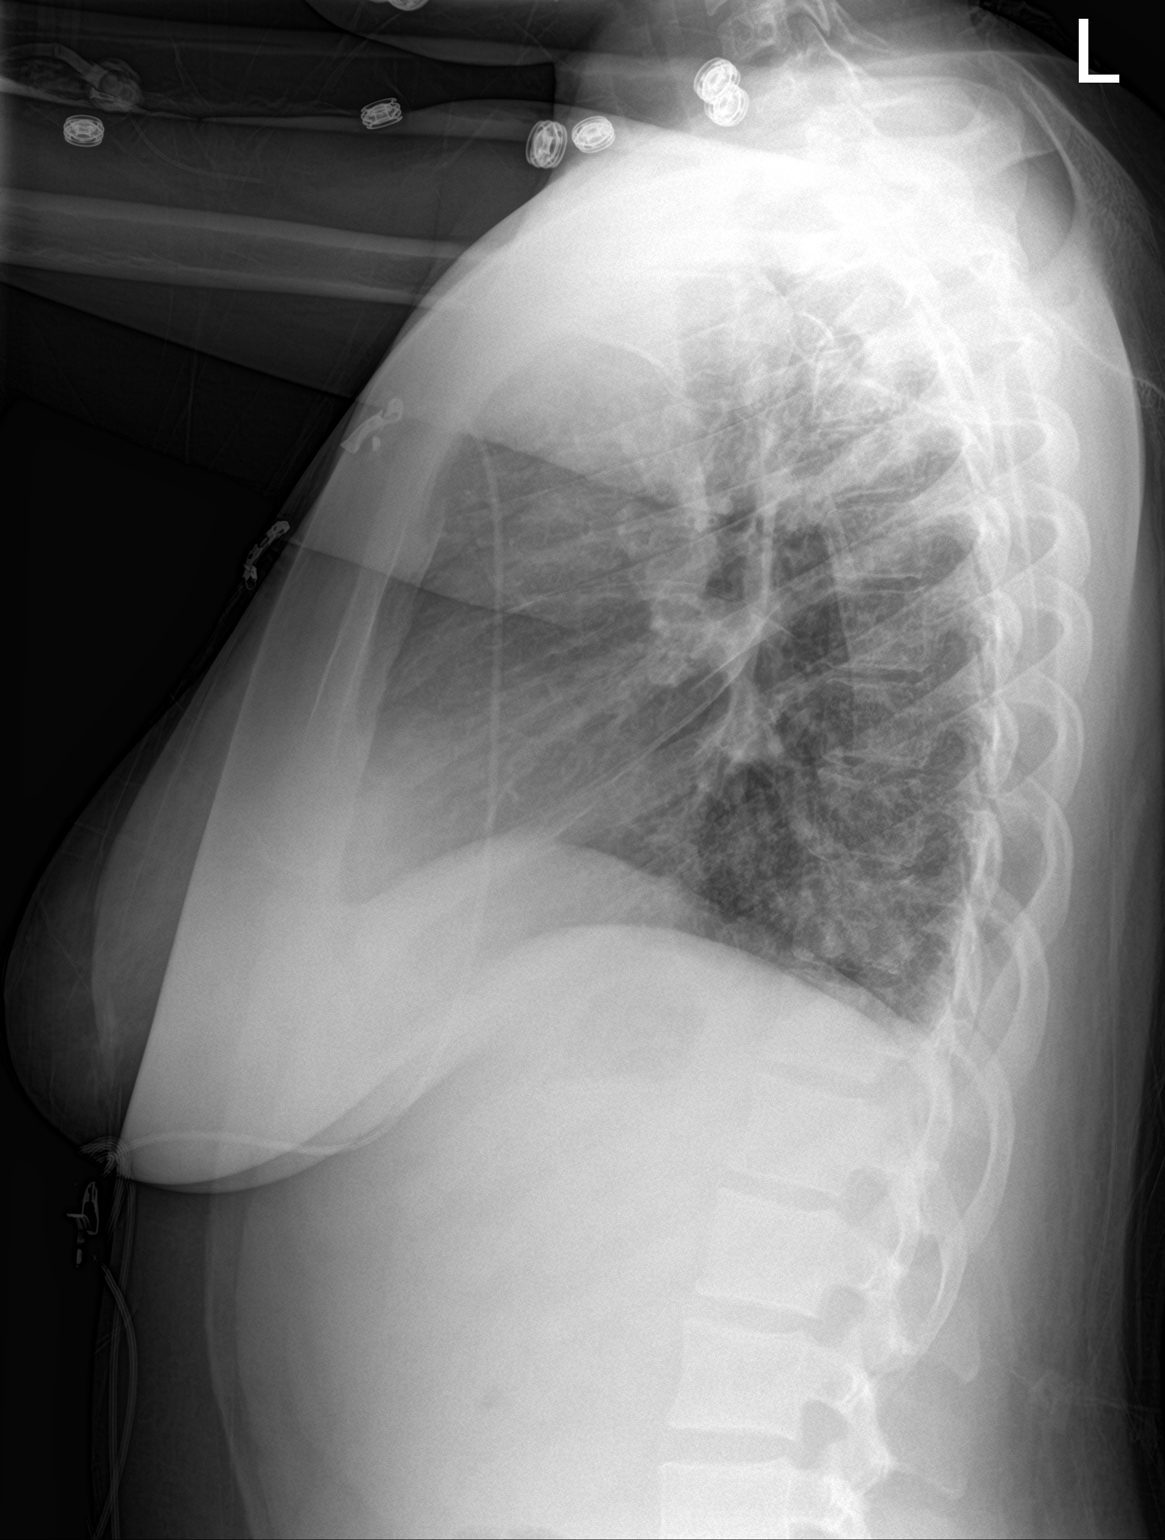

[2 of 2 positions shown; findings below may reference images not displayed]

FINDINGS: On the lateral view, there are slightly prominent markings
posteriorly suspicious for lower lobe pneumonia possibly medially at
the right lung base. Followup chest x-ray is recommended. No pleural
effusion is seen. Mediastinal and hilar contours are unremarkable.
The heart is within normal limits in size. No bony abnormality is
seen.
IMPRESSION: Suspect patchy possibly right lower lobe pneumonia. Recommend
followup.
